# Patient Record
Sex: Male | Born: 1935 | Race: Black or African American | Hispanic: No | Marital: Married | Smoking: Former smoker
Health system: Southern US, Community
[De-identification: ages and names within clinical notes are randomized; demographics above are authoritative.]

## PROBLEM LIST (undated history)

## (undated) DIAGNOSIS — D369 Benign neoplasm, unspecified site: Secondary | ICD-10-CM

## (undated) DIAGNOSIS — I10 Essential (primary) hypertension: Secondary | ICD-10-CM

## (undated) DIAGNOSIS — M109 Gout, unspecified: Secondary | ICD-10-CM

## (undated) DIAGNOSIS — G8220 Paraplegia, unspecified: Secondary | ICD-10-CM

## (undated) DIAGNOSIS — Z5189 Encounter for other specified aftercare: Secondary | ICD-10-CM

## (undated) DIAGNOSIS — F329 Major depressive disorder, single episode, unspecified: Secondary | ICD-10-CM

## (undated) DIAGNOSIS — K635 Polyp of colon: Secondary | ICD-10-CM

## (undated) DIAGNOSIS — L8990 Pressure ulcer of unspecified site, unspecified stage: Secondary | ICD-10-CM

## (undated) DIAGNOSIS — IMO0001 Reserved for inherently not codable concepts without codable children: Secondary | ICD-10-CM

## (undated) DIAGNOSIS — N390 Urinary tract infection, site not specified: Secondary | ICD-10-CM

## (undated) DIAGNOSIS — H269 Unspecified cataract: Secondary | ICD-10-CM

## (undated) DIAGNOSIS — J189 Pneumonia, unspecified organism: Secondary | ICD-10-CM

## (undated) DIAGNOSIS — N210 Calculus in bladder: Secondary | ICD-10-CM

## (undated) DIAGNOSIS — F32A Depression, unspecified: Secondary | ICD-10-CM

## (undated) DIAGNOSIS — M1990 Unspecified osteoarthritis, unspecified site: Secondary | ICD-10-CM

## (undated) DIAGNOSIS — K219 Gastro-esophageal reflux disease without esophagitis: Secondary | ICD-10-CM

## (undated) DIAGNOSIS — G629 Polyneuropathy, unspecified: Secondary | ICD-10-CM

## (undated) DIAGNOSIS — F0390 Unspecified dementia without behavioral disturbance: Secondary | ICD-10-CM

## (undated) DIAGNOSIS — M62838 Other muscle spasm: Secondary | ICD-10-CM

## (undated) HISTORY — DX: Pressure ulcer of unspecified site, unspecified stage: L89.90

## (undated) HISTORY — DX: Gastro-esophageal reflux disease without esophagitis: K21.9

## (undated) HISTORY — DX: Gout, unspecified: M10.9

## (undated) HISTORY — DX: Unspecified cataract: H26.9

## (undated) HISTORY — PX: NASAL FRACTURE SURGERY: SHX718

## (undated) HISTORY — DX: Urinary tract infection, site not specified: N39.0

## (undated) HISTORY — DX: Depression, unspecified: F32.A

## (undated) HISTORY — DX: Major depressive disorder, single episode, unspecified: F32.9

## (undated) HISTORY — DX: Polyp of colon: K63.5

## (undated) HISTORY — DX: Benign neoplasm, unspecified site: D36.9

## (undated) HISTORY — DX: Paraplegia, unspecified: G82.20

## (undated) HISTORY — DX: Unspecified dementia, unspecified severity, without behavioral disturbance, psychotic disturbance, mood disturbance, and anxiety: F03.90

## (undated) HISTORY — DX: Polyneuropathy, unspecified: G62.9

## (undated) HISTORY — DX: Calculus in bladder: N21.0

## (undated) HISTORY — DX: Other muscle spasm: M62.838

## (undated) HISTORY — PX: BACK SURGERY: SHX140

---

## 1997-09-29 ENCOUNTER — Emergency Department (HOSPITAL_COMMUNITY): Admission: EM | Admit: 1997-09-29 | Discharge: 1997-09-29 | Payer: Self-pay | Admitting: Emergency Medicine

## 1997-11-23 ENCOUNTER — Ambulatory Visit (HOSPITAL_COMMUNITY): Admission: RE | Admit: 1997-11-23 | Discharge: 1997-11-23 | Payer: Self-pay | Admitting: Neurosurgery

## 2000-12-05 ENCOUNTER — Encounter: Payer: Self-pay | Admitting: Urology

## 2000-12-10 ENCOUNTER — Ambulatory Visit (HOSPITAL_COMMUNITY): Admission: RE | Admit: 2000-12-10 | Discharge: 2000-12-10 | Payer: Self-pay | Admitting: Urology

## 2000-12-10 ENCOUNTER — Encounter (INDEPENDENT_AMBULATORY_CARE_PROVIDER_SITE_OTHER): Payer: Self-pay | Admitting: Specialist

## 2004-05-30 ENCOUNTER — Ambulatory Visit: Payer: Self-pay | Admitting: Internal Medicine

## 2004-07-12 ENCOUNTER — Ambulatory Visit: Payer: Self-pay | Admitting: Internal Medicine

## 2004-07-13 ENCOUNTER — Encounter: Admission: RE | Admit: 2004-07-13 | Discharge: 2004-07-13 | Payer: Self-pay | Admitting: Internal Medicine

## 2004-07-28 ENCOUNTER — Ambulatory Visit: Payer: Self-pay | Admitting: Internal Medicine

## 2005-04-03 ENCOUNTER — Ambulatory Visit: Payer: Self-pay | Admitting: Internal Medicine

## 2005-08-21 ENCOUNTER — Ambulatory Visit: Payer: Self-pay | Admitting: Internal Medicine

## 2005-10-23 ENCOUNTER — Ambulatory Visit: Payer: Self-pay | Admitting: Internal Medicine

## 2006-04-12 ENCOUNTER — Ambulatory Visit: Payer: Self-pay | Admitting: Internal Medicine

## 2006-10-14 ENCOUNTER — Encounter: Payer: Self-pay | Admitting: Internal Medicine

## 2006-10-14 ENCOUNTER — Ambulatory Visit: Payer: Self-pay | Admitting: Internal Medicine

## 2006-10-28 ENCOUNTER — Encounter: Payer: Self-pay | Admitting: Internal Medicine

## 2006-11-06 ENCOUNTER — Emergency Department (HOSPITAL_COMMUNITY): Admission: EM | Admit: 2006-11-06 | Discharge: 2006-11-06 | Payer: Self-pay | Admitting: Emergency Medicine

## 2006-11-13 DIAGNOSIS — I10 Essential (primary) hypertension: Secondary | ICD-10-CM | POA: Insufficient documentation

## 2006-11-13 DIAGNOSIS — M1990 Unspecified osteoarthritis, unspecified site: Secondary | ICD-10-CM | POA: Insufficient documentation

## 2006-11-25 ENCOUNTER — Ambulatory Visit: Payer: Self-pay | Admitting: Internal Medicine

## 2006-11-25 DIAGNOSIS — T887XXA Unspecified adverse effect of drug or medicament, initial encounter: Secondary | ICD-10-CM

## 2006-11-25 DIAGNOSIS — M109 Gout, unspecified: Secondary | ICD-10-CM

## 2006-11-25 LAB — CONVERTED CEMR LAB
Chloride: 110 meq/L (ref 96–112)
Creatinine, Ser: 1.1 mg/dL (ref 0.4–1.5)
Sodium: 141 meq/L (ref 135–145)

## 2007-02-18 ENCOUNTER — Emergency Department (HOSPITAL_COMMUNITY): Admission: EM | Admit: 2007-02-18 | Discharge: 2007-02-19 | Payer: Self-pay | Admitting: Emergency Medicine

## 2007-02-19 ENCOUNTER — Telehealth: Payer: Self-pay | Admitting: Internal Medicine

## 2007-03-17 ENCOUNTER — Ambulatory Visit: Payer: Self-pay | Admitting: Internal Medicine

## 2007-03-28 ENCOUNTER — Ambulatory Visit: Payer: Self-pay | Admitting: Internal Medicine

## 2007-03-28 DIAGNOSIS — F329 Major depressive disorder, single episode, unspecified: Secondary | ICD-10-CM

## 2007-03-28 DIAGNOSIS — K219 Gastro-esophageal reflux disease without esophagitis: Secondary | ICD-10-CM

## 2007-04-07 ENCOUNTER — Ambulatory Visit: Payer: Self-pay | Admitting: Gastroenterology

## 2007-05-19 ENCOUNTER — Telehealth: Payer: Self-pay | Admitting: Internal Medicine

## 2007-05-19 ENCOUNTER — Ambulatory Visit: Payer: Self-pay | Admitting: Internal Medicine

## 2007-05-19 DIAGNOSIS — S335XXA Sprain of ligaments of lumbar spine, initial encounter: Secondary | ICD-10-CM

## 2007-05-19 LAB — CONVERTED CEMR LAB
Blood in Urine, dipstick: NEGATIVE
Glucose, Urine, Semiquant: NEGATIVE
Protein, U semiquant: NEGATIVE
WBC Urine, dipstick: NEGATIVE

## 2007-11-10 ENCOUNTER — Ambulatory Visit: Payer: Self-pay | Admitting: Internal Medicine

## 2008-02-11 ENCOUNTER — Ambulatory Visit: Payer: Self-pay | Admitting: Internal Medicine

## 2008-02-11 DIAGNOSIS — G4700 Insomnia, unspecified: Secondary | ICD-10-CM | POA: Insufficient documentation

## 2008-02-11 LAB — CONVERTED CEMR LAB
BUN: 24 mg/dL — ABNORMAL HIGH (ref 6–23)
Basophils Relative: 0.3 % (ref 0.0–3.0)
Creatinine, Ser: 1 mg/dL (ref 0.4–1.5)
Eosinophils Absolute: 0.2 10*3/uL (ref 0.0–0.7)
Eosinophils Relative: 5.8 % — ABNORMAL HIGH (ref 0.0–5.0)
GFR calc Af Amer: 94 mL/min
GFR calc non Af Amer: 78 mL/min
HCT: 42.1 % (ref 39.0–52.0)
Hemoglobin: 14.3 g/dL (ref 13.0–17.0)
MCV: 88.7 fL (ref 78.0–100.0)
Monocytes Absolute: 0.3 10*3/uL (ref 0.1–1.0)
Neutro Abs: 1.5 10*3/uL (ref 1.4–7.7)
RBC: 4.75 M/uL (ref 4.22–5.81)
WBC: 3.3 10*3/uL — ABNORMAL LOW (ref 4.5–10.5)

## 2008-02-25 ENCOUNTER — Ambulatory Visit: Payer: Self-pay | Admitting: Internal Medicine

## 2008-02-25 ENCOUNTER — Inpatient Hospital Stay (HOSPITAL_COMMUNITY): Admission: AC | Admit: 2008-02-25 | Discharge: 2008-03-19 | Payer: Self-pay

## 2008-02-25 ENCOUNTER — Ambulatory Visit: Payer: Self-pay | Admitting: Surgery

## 2008-02-25 ENCOUNTER — Ambulatory Visit: Payer: Self-pay | Admitting: Critical Care Medicine

## 2008-02-26 ENCOUNTER — Ambulatory Visit: Payer: Self-pay | Admitting: Surgery

## 2008-03-08 ENCOUNTER — Ambulatory Visit: Payer: Self-pay | Admitting: Physical Medicine & Rehabilitation

## 2008-03-18 ENCOUNTER — Encounter: Payer: Self-pay | Admitting: Internal Medicine

## 2008-04-12 ENCOUNTER — Emergency Department (HOSPITAL_COMMUNITY): Admission: EM | Admit: 2008-04-12 | Discharge: 2008-04-13 | Payer: Self-pay | Admitting: *Deleted

## 2008-05-26 ENCOUNTER — Encounter: Payer: Self-pay | Admitting: Internal Medicine

## 2008-05-26 ENCOUNTER — Encounter: Admission: RE | Admit: 2008-05-26 | Discharge: 2008-05-26 | Payer: Self-pay | Admitting: Neurosurgery

## 2008-06-15 ENCOUNTER — Encounter: Payer: Self-pay | Admitting: Internal Medicine

## 2008-06-18 ENCOUNTER — Inpatient Hospital Stay (HOSPITAL_COMMUNITY)
Admission: RE | Admit: 2008-06-18 | Discharge: 2008-07-09 | Payer: Self-pay | Admitting: Physical Medicine & Rehabilitation

## 2008-06-18 ENCOUNTER — Ambulatory Visit: Payer: Self-pay | Admitting: Physical Medicine & Rehabilitation

## 2008-06-19 ENCOUNTER — Ambulatory Visit: Payer: Self-pay | Admitting: Physical Medicine & Rehabilitation

## 2008-08-31 ENCOUNTER — Telehealth: Payer: Self-pay | Admitting: Internal Medicine

## 2008-09-07 ENCOUNTER — Telehealth: Payer: Self-pay | Admitting: Internal Medicine

## 2008-09-09 ENCOUNTER — Encounter
Admission: RE | Admit: 2008-09-09 | Discharge: 2008-09-09 | Payer: Self-pay | Admitting: Physical Medicine & Rehabilitation

## 2008-09-14 ENCOUNTER — Telehealth: Payer: Self-pay | Admitting: Internal Medicine

## 2008-09-14 DIAGNOSIS — M6281 Muscle weakness (generalized): Secondary | ICD-10-CM

## 2008-09-15 ENCOUNTER — Ambulatory Visit: Payer: Self-pay | Admitting: Internal Medicine

## 2008-09-15 DIAGNOSIS — IMO0002 Reserved for concepts with insufficient information to code with codable children: Secondary | ICD-10-CM | POA: Insufficient documentation

## 2008-09-16 ENCOUNTER — Ambulatory Visit: Payer: Self-pay | Admitting: Internal Medicine

## 2008-09-23 ENCOUNTER — Telehealth (INDEPENDENT_AMBULATORY_CARE_PROVIDER_SITE_OTHER): Payer: Self-pay | Admitting: *Deleted

## 2008-09-28 ENCOUNTER — Telehealth: Payer: Self-pay | Admitting: Internal Medicine

## 2008-09-30 ENCOUNTER — Ambulatory Visit: Payer: Self-pay | Admitting: Internal Medicine

## 2008-09-30 ENCOUNTER — Telehealth: Payer: Self-pay | Admitting: Internal Medicine

## 2008-09-30 ENCOUNTER — Inpatient Hospital Stay (HOSPITAL_COMMUNITY): Admission: EM | Admit: 2008-09-30 | Discharge: 2008-10-04 | Payer: Self-pay | Admitting: Emergency Medicine

## 2008-09-30 DIAGNOSIS — R197 Diarrhea, unspecified: Secondary | ICD-10-CM

## 2008-10-08 ENCOUNTER — Telehealth: Payer: Self-pay | Admitting: Internal Medicine

## 2008-10-12 ENCOUNTER — Encounter: Admission: RE | Admit: 2008-10-12 | Discharge: 2009-01-10 | Payer: Self-pay | Admitting: Internal Medicine

## 2008-10-26 ENCOUNTER — Telehealth (INDEPENDENT_AMBULATORY_CARE_PROVIDER_SITE_OTHER): Payer: Self-pay | Admitting: *Deleted

## 2008-10-27 ENCOUNTER — Encounter: Payer: Self-pay | Admitting: Internal Medicine

## 2008-10-29 ENCOUNTER — Ambulatory Visit: Payer: Self-pay | Admitting: Internal Medicine

## 2008-11-03 ENCOUNTER — Encounter: Payer: Self-pay | Admitting: Internal Medicine

## 2008-11-22 ENCOUNTER — Telehealth (INDEPENDENT_AMBULATORY_CARE_PROVIDER_SITE_OTHER): Payer: Self-pay | Admitting: *Deleted

## 2008-12-19 ENCOUNTER — Encounter: Payer: Self-pay | Admitting: Family Medicine

## 2008-12-19 ENCOUNTER — Emergency Department (HOSPITAL_COMMUNITY): Admission: EM | Admit: 2008-12-19 | Discharge: 2008-12-19 | Payer: Self-pay | Admitting: Emergency Medicine

## 2008-12-28 ENCOUNTER — Telehealth: Payer: Self-pay | Admitting: Internal Medicine

## 2008-12-29 ENCOUNTER — Ambulatory Visit: Payer: Self-pay | Admitting: Family Medicine

## 2008-12-29 DIAGNOSIS — R1084 Generalized abdominal pain: Secondary | ICD-10-CM

## 2008-12-31 ENCOUNTER — Telehealth: Payer: Self-pay | Admitting: Internal Medicine

## 2009-01-05 ENCOUNTER — Encounter: Payer: Self-pay | Admitting: Internal Medicine

## 2009-01-12 ENCOUNTER — Ambulatory Visit: Payer: Self-pay | Admitting: Internal Medicine

## 2009-01-12 DIAGNOSIS — K279 Peptic ulcer, site unspecified, unspecified as acute or chronic, without hemorrhage or perforation: Secondary | ICD-10-CM | POA: Insufficient documentation

## 2009-01-12 LAB — CONVERTED CEMR LAB
AST: 16 units/L (ref 0–37)
Albumin: 3.4 g/dL — ABNORMAL LOW (ref 3.5–5.2)
Amylase: 98 units/L (ref 27–131)
Basophils Absolute: 0 10*3/uL (ref 0.0–0.1)
Eosinophils Absolute: 0.2 10*3/uL (ref 0.0–0.7)
Lymphocytes Relative: 35 % (ref 12.0–46.0)
MCHC: 33.1 g/dL (ref 30.0–36.0)
MCV: 87.9 fL (ref 78.0–100.0)
Monocytes Absolute: 0.3 10*3/uL (ref 0.1–1.0)
Neutro Abs: 1.6 10*3/uL (ref 1.4–7.7)
Neutrophils Relative %: 50.2 % (ref 43.0–77.0)
RDW: 14.8 % — ABNORMAL HIGH (ref 11.5–14.6)
Saturation Ratios: 31 % (ref 20.0–50.0)
Total Protein: 6 g/dL (ref 6.0–8.3)
Transferrin: 218.6 mg/dL (ref 212.0–360.0)

## 2009-01-14 ENCOUNTER — Telehealth: Payer: Self-pay | Admitting: Internal Medicine

## 2009-01-20 ENCOUNTER — Encounter: Payer: Self-pay | Admitting: Internal Medicine

## 2009-01-25 ENCOUNTER — Encounter (INDEPENDENT_AMBULATORY_CARE_PROVIDER_SITE_OTHER): Payer: Self-pay | Admitting: *Deleted

## 2009-01-25 ENCOUNTER — Encounter: Admission: RE | Admit: 2009-01-25 | Discharge: 2009-04-04 | Payer: Self-pay | Admitting: Internal Medicine

## 2009-01-27 ENCOUNTER — Telehealth: Payer: Self-pay | Admitting: Internal Medicine

## 2009-01-28 ENCOUNTER — Ambulatory Visit: Payer: Self-pay | Admitting: Internal Medicine

## 2009-01-28 DIAGNOSIS — K5900 Constipation, unspecified: Secondary | ICD-10-CM | POA: Insufficient documentation

## 2009-01-28 DIAGNOSIS — R142 Eructation: Secondary | ICD-10-CM

## 2009-01-28 DIAGNOSIS — R143 Flatulence: Secondary | ICD-10-CM

## 2009-01-28 DIAGNOSIS — R141 Gas pain: Secondary | ICD-10-CM

## 2009-01-28 LAB — CONVERTED CEMR LAB
Basophils Absolute: 0 10*3/uL (ref 0.0–0.1)
Basophils Relative: 0.6 % (ref 0.0–3.0)
Eosinophils Absolute: 0.1 10*3/uL (ref 0.0–0.7)
Lipase: 8 units/L — ABNORMAL LOW (ref 11.0–59.0)
Lymphocytes Relative: 36.4 % (ref 12.0–46.0)
MCHC: 33.7 g/dL (ref 30.0–36.0)
Monocytes Absolute: 0.4 10*3/uL (ref 0.1–1.0)
Neutrophils Relative %: 45.3 % (ref 43.0–77.0)
Platelets: 194 10*3/uL (ref 150.0–400.0)
RBC: 4.23 M/uL (ref 4.22–5.81)

## 2009-02-02 ENCOUNTER — Telehealth: Payer: Self-pay | Admitting: Physician Assistant

## 2009-02-02 ENCOUNTER — Ambulatory Visit: Payer: Self-pay | Admitting: Gastroenterology

## 2009-02-02 ENCOUNTER — Inpatient Hospital Stay (HOSPITAL_COMMUNITY): Admission: AD | Admit: 2009-02-02 | Discharge: 2009-02-04 | Payer: Self-pay | Admitting: Gastroenterology

## 2009-02-03 ENCOUNTER — Encounter: Payer: Self-pay | Admitting: Internal Medicine

## 2009-02-04 ENCOUNTER — Encounter (INDEPENDENT_AMBULATORY_CARE_PROVIDER_SITE_OTHER): Payer: Self-pay | Admitting: *Deleted

## 2009-02-04 ENCOUNTER — Encounter: Payer: Self-pay | Admitting: Gastroenterology

## 2009-02-04 DIAGNOSIS — K635 Polyp of colon: Secondary | ICD-10-CM

## 2009-02-04 DIAGNOSIS — D369 Benign neoplasm, unspecified site: Secondary | ICD-10-CM

## 2009-02-04 HISTORY — DX: Benign neoplasm, unspecified site: D36.9

## 2009-02-04 HISTORY — DX: Polyp of colon: K63.5

## 2009-02-07 ENCOUNTER — Telehealth: Payer: Self-pay | Admitting: Gastroenterology

## 2009-02-09 ENCOUNTER — Encounter: Payer: Self-pay | Admitting: Gastroenterology

## 2009-02-11 ENCOUNTER — Encounter: Payer: Self-pay | Admitting: Internal Medicine

## 2009-02-14 ENCOUNTER — Ambulatory Visit: Payer: Self-pay | Admitting: Internal Medicine

## 2009-02-14 DIAGNOSIS — K208 Other esophagitis: Secondary | ICD-10-CM

## 2009-03-15 ENCOUNTER — Ambulatory Visit: Payer: Self-pay | Admitting: Gastroenterology

## 2009-03-22 ENCOUNTER — Encounter: Payer: Self-pay | Admitting: Internal Medicine

## 2009-04-26 ENCOUNTER — Encounter (INDEPENDENT_AMBULATORY_CARE_PROVIDER_SITE_OTHER): Payer: Self-pay | Admitting: *Deleted

## 2009-04-28 ENCOUNTER — Ambulatory Visit: Payer: Self-pay | Admitting: Gastroenterology

## 2009-04-29 ENCOUNTER — Ambulatory Visit: Payer: Self-pay | Admitting: Gastroenterology

## 2009-04-29 ENCOUNTER — Telehealth: Payer: Self-pay | Admitting: Gastroenterology

## 2009-05-09 ENCOUNTER — Ambulatory Visit: Payer: Self-pay | Admitting: Internal Medicine

## 2009-05-09 DIAGNOSIS — R631 Polydipsia: Secondary | ICD-10-CM | POA: Insufficient documentation

## 2009-05-11 LAB — CONVERTED CEMR LAB
Basophils Relative: 0.1 % (ref 0.0–3.0)
Calcium: 8.2 mg/dL — ABNORMAL LOW (ref 8.4–10.5)
Eosinophils Absolute: 7.7 10*3/uL — ABNORMAL HIGH (ref 0.0–0.7)
GFR calc non Af Amer: 84.18 mL/min (ref >60)
Glucose, Bld: 78 mg/dL (ref 70–99)
Hemoglobin: 12.8 g/dL — ABNORMAL LOW (ref 13.0–17.0)
Hgb A1c MFr Bld: 5.7 % (ref 4.6–6.5)
Lymphocytes Relative: 13.6 % (ref 12.0–46.0)
Monocytes Relative: 4.9 % (ref 3.0–12.0)
Neutro Abs: 1.6 10*3/uL (ref 1.4–7.7)
Neutrophils Relative %: 14.1 % — ABNORMAL LOW (ref 43.0–77.0)
Potassium: 2.8 meq/L — CL (ref 3.5–5.1)
RBC: 4.29 M/uL (ref 4.22–5.81)
Sodium: 145 meq/L (ref 135–145)
WBC: 11.5 10*3/uL — ABNORMAL HIGH (ref 4.5–10.5)

## 2009-05-16 ENCOUNTER — Ambulatory Visit: Payer: Self-pay | Admitting: Internal Medicine

## 2009-06-10 ENCOUNTER — Ambulatory Visit: Payer: Self-pay | Admitting: Internal Medicine

## 2009-06-10 LAB — CONVERTED CEMR LAB: Potassium: 3.7 meq/L (ref 3.5–5.1)

## 2009-06-14 ENCOUNTER — Telehealth: Payer: Self-pay | Admitting: Internal Medicine

## 2009-06-14 ENCOUNTER — Ambulatory Visit: Payer: Self-pay | Admitting: Internal Medicine

## 2009-07-08 ENCOUNTER — Ambulatory Visit: Payer: Self-pay | Admitting: Internal Medicine

## 2009-07-08 DIAGNOSIS — G609 Hereditary and idiopathic neuropathy, unspecified: Secondary | ICD-10-CM | POA: Insufficient documentation

## 2009-07-08 LAB — CONVERTED CEMR LAB

## 2009-07-11 ENCOUNTER — Telehealth: Payer: Self-pay | Admitting: *Deleted

## 2009-07-22 ENCOUNTER — Telehealth: Payer: Self-pay | Admitting: Internal Medicine

## 2009-08-26 ENCOUNTER — Emergency Department (HOSPITAL_COMMUNITY): Admission: EM | Admit: 2009-08-26 | Discharge: 2009-08-26 | Payer: Self-pay | Admitting: Emergency Medicine

## 2009-08-26 ENCOUNTER — Telehealth: Payer: Self-pay | Admitting: Internal Medicine

## 2009-10-14 ENCOUNTER — Telehealth: Payer: Self-pay | Admitting: Internal Medicine

## 2009-11-04 ENCOUNTER — Ambulatory Visit: Payer: Self-pay | Admitting: Gastroenterology

## 2009-11-04 DIAGNOSIS — R1012 Left upper quadrant pain: Secondary | ICD-10-CM | POA: Insufficient documentation

## 2009-11-04 LAB — CONVERTED CEMR LAB
Basophils Absolute: 0 10*3/uL (ref 0.0–0.1)
CO2: 30 meq/L (ref 19–32)
Chloride: 104 meq/L (ref 96–112)
Eosinophils Relative: 6.1 % — ABNORMAL HIGH (ref 0.0–5.0)
Glucose, Bld: 82 mg/dL (ref 70–99)
HCT: 36.9 % — ABNORMAL LOW (ref 39.0–52.0)
Hemoglobin: 12.3 g/dL — ABNORMAL LOW (ref 13.0–17.0)
Lymphocytes Relative: 33.1 % (ref 12.0–46.0)
Lymphs Abs: 1.3 10*3/uL (ref 0.7–4.0)
Monocytes Relative: 8.4 % (ref 3.0–12.0)
Neutro Abs: 2.1 10*3/uL (ref 1.4–7.7)
Platelets: 227 10*3/uL (ref 150.0–400.0)
RDW: 15.4 % — ABNORMAL HIGH (ref 11.5–14.6)
Sodium: 138 meq/L (ref 135–145)
WBC: 4 10*3/uL — ABNORMAL LOW (ref 4.5–10.5)

## 2009-11-07 ENCOUNTER — Ambulatory Visit: Payer: Self-pay | Admitting: Cardiovascular Disease

## 2009-11-10 ENCOUNTER — Ambulatory Visit: Payer: Self-pay | Admitting: Internal Medicine

## 2009-11-10 DIAGNOSIS — R079 Chest pain, unspecified: Secondary | ICD-10-CM

## 2009-11-10 DIAGNOSIS — N401 Enlarged prostate with lower urinary tract symptoms: Secondary | ICD-10-CM

## 2010-01-10 ENCOUNTER — Ambulatory Visit: Payer: Self-pay | Admitting: Internal Medicine

## 2010-03-13 ENCOUNTER — Ambulatory Visit: Payer: Self-pay | Admitting: Internal Medicine

## 2010-03-13 DIAGNOSIS — R609 Edema, unspecified: Secondary | ICD-10-CM

## 2010-03-13 LAB — CONVERTED CEMR LAB
ALT: 14 units/L (ref 0–53)
AST: 19 units/L (ref 0–37)
Albumin: 3.7 g/dL (ref 3.5–5.2)
BUN: 19 mg/dL (ref 6–23)
Chloride: 109 meq/L (ref 96–112)
Creatinine, Ser: 0.9 mg/dL (ref 0.4–1.5)
Glucose, Bld: 86 mg/dL (ref 70–99)
Hgb A1c MFr Bld: 5.8 % (ref 4.6–6.5)
Potassium: 3.6 meq/L (ref 3.5–5.1)
Total Protein: 6.3 g/dL (ref 6.0–8.3)

## 2010-03-21 ENCOUNTER — Telehealth: Payer: Self-pay | Admitting: Internal Medicine
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
## 2010-03-23 IMAGING — CR DG CHEST 1V PORT
1 series · 1 of 1 positions shown · non-contrast
Comparison: Earlier today.

CLINICAL DATA: PICC placement.

PORTABLE CHEST - 1 VIEW

[AP]
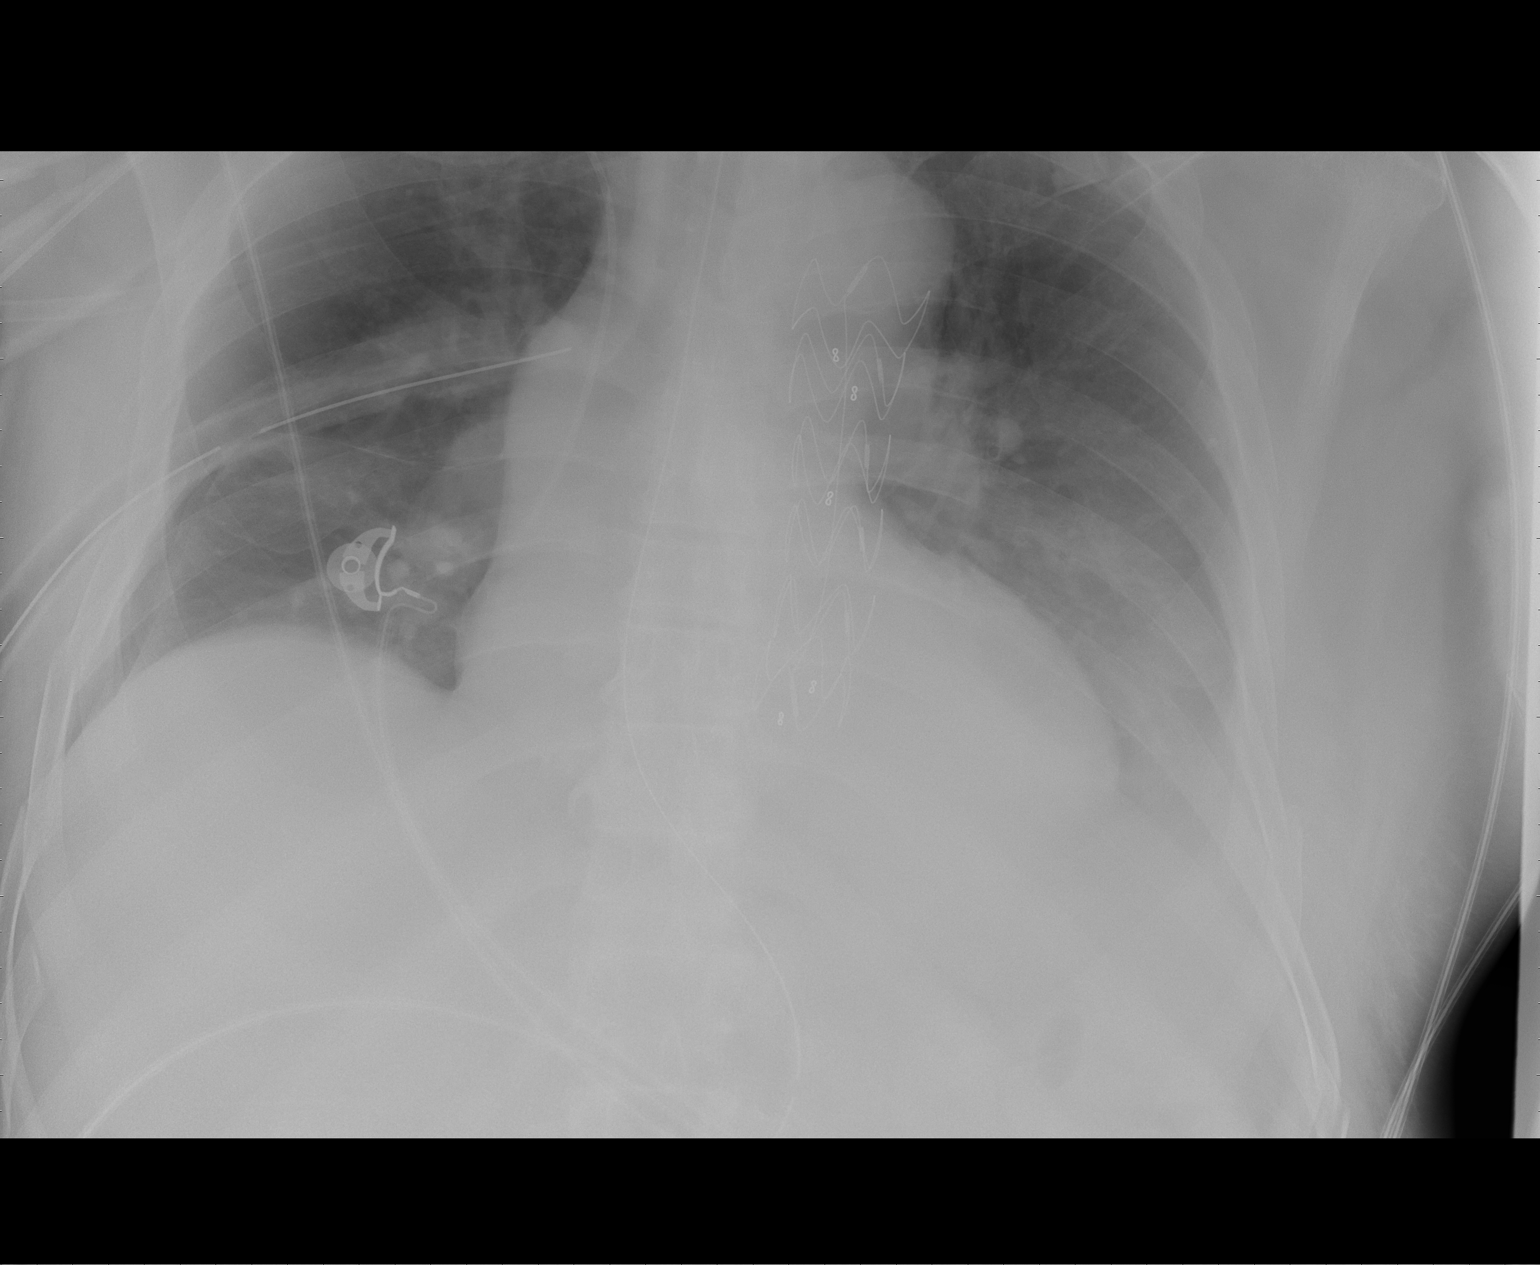

[1 of 1 positions shown; findings below may reference images not displayed]

FINDINGS: An interval right PICC is demonstrated with its tip at
the junction of the superior vena cava and right atrium.  A right
chest tube remains in place.  Currently, an approximately 10% right
lateral pneumothorax is noted.  An aortic stent remains in place.
Nasogastric tube extending into the stomach.  Previously noted
bilateral rib fractures.  Stable borderline enlargement of the
cardiac silhouette and left basilar opacity.
IMPRESSION: 1.  Approximately 10% right lateral pneumothorax.
2.  Right PICC tip at the junction of the superior vena cava and
right atrium.
3.  Stable left basilar atelectasis, pulmonary contusion or
pneumonia and probable left pleural effusion.

## 2010-03-23 IMAGING — CR DG CHEST 1V PORT
1 series · 1 of 1 positions shown · non-contrast
Comparison: Same day at five:  53

CLINICAL DATA: Endotracheal tube, hypoxia

PORTABLE CHEST - 1 VIEW

[view not recorded]
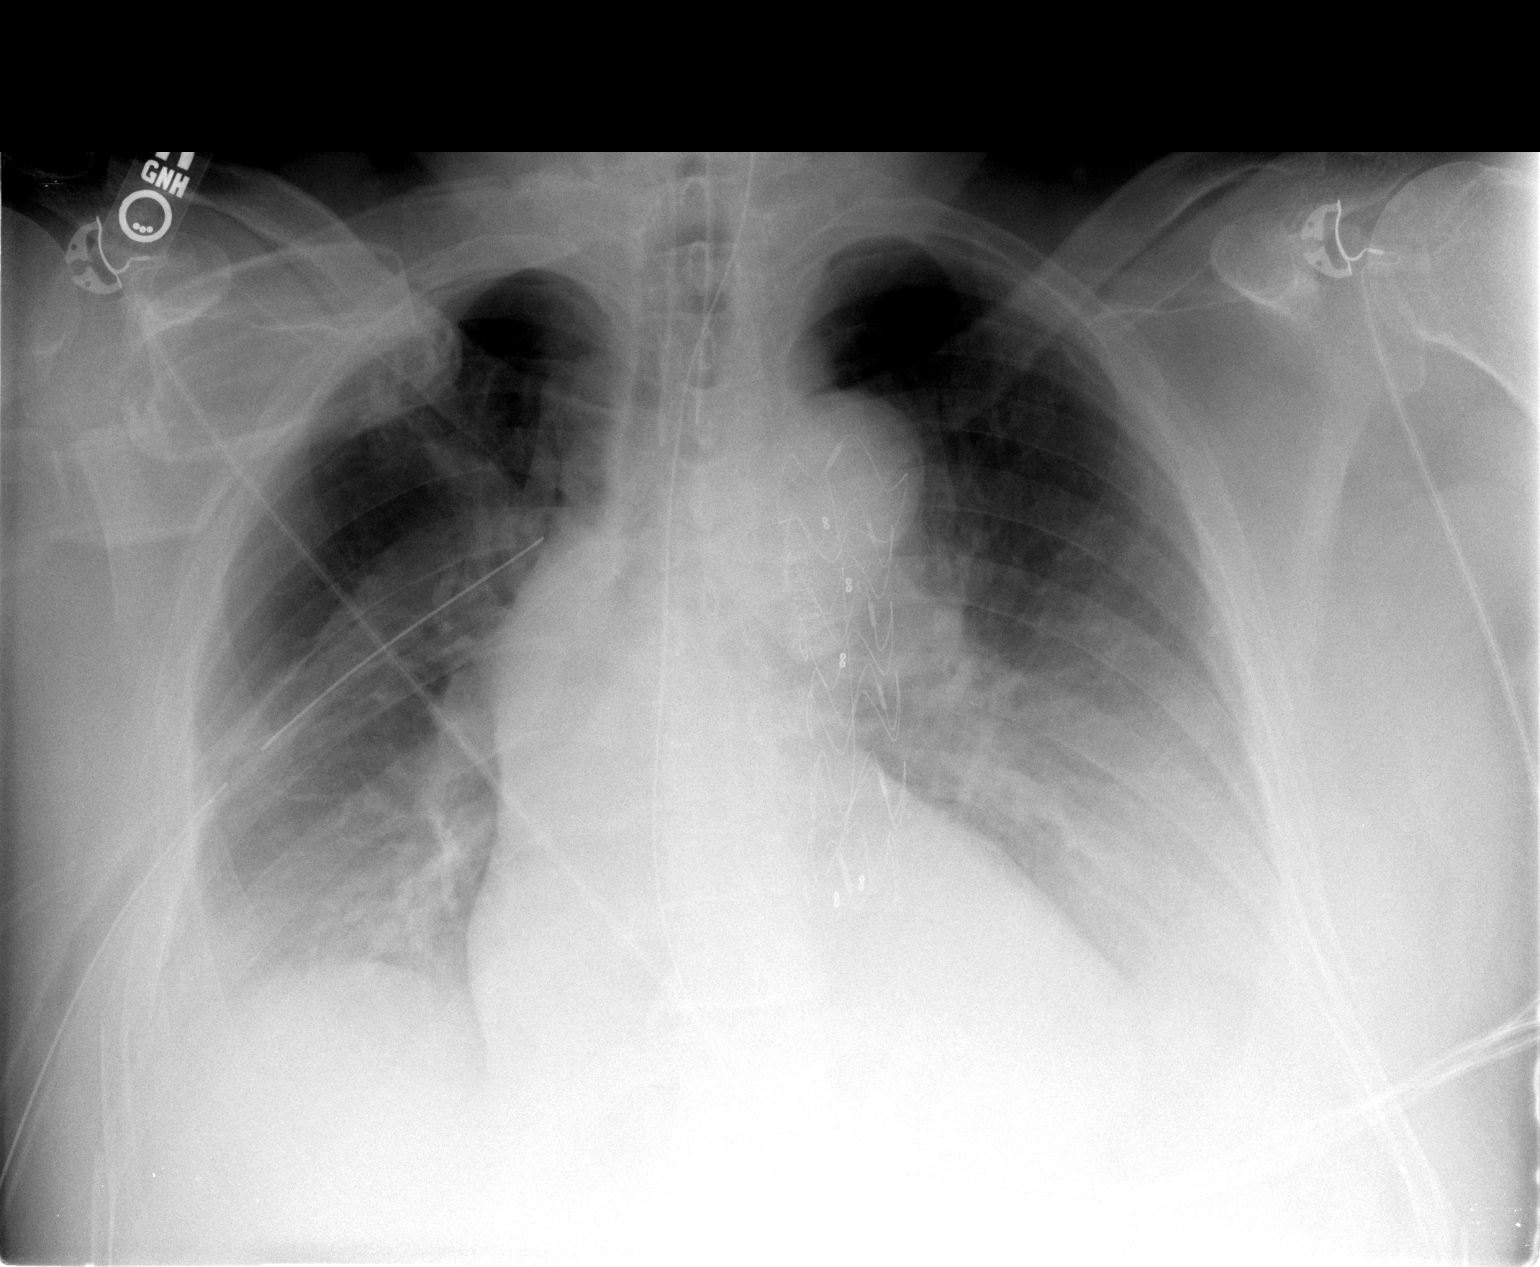

[1 of 1 positions shown; findings below may reference images not displayed]

FINDINGS: Cardiomediastinal silhouette is stable.  Right chest tube
is unchanged in position.  Endotracheal tube in place noted with
tip 4.4 cm above the carina.  NG tube is unchanged in position.
There is mild interstitial prominence bilaterally suspicious for
mild congestion/edema.  There is bilateral small pleural effusion
with bilateral basilar atelectasis or lung contusion.  Thoracic
aortic stent again noted unchanged in position. Bilateral rib
fractures again noted.
IMPRESSION: Endotracheal tube in place with tip 4.4 cm above the
carina.
Right chest tube is unchanged in position.  No diagnostic
pneumothorax.  Mild interstitial prominence bilaterally suspicious
for mild congestion/edema.  Bilateral small pleural effusion with
bilateral basilar atelectasis or lung contusion.  Bilateral rib
fractures again noted.
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
## 2010-03-29 ENCOUNTER — Encounter: Payer: Self-pay | Admitting: Internal Medicine

## 2010-04-11 ENCOUNTER — Encounter: Payer: Self-pay | Admitting: Internal Medicine

## 2010-05-09 NOTE — Miscellaneous (Signed)
Summary: Discharge Summary for PT Services/[facility]  Discharge Summary for PT Services/[facility]   Imported By: Maryln Gottron 04/15/2009 10:15:48  _____________________________________________________________________  External Attachment:    Type:   Image     Comment:   External Document
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
## 2010-05-09 NOTE — Progress Notes (Signed)
   Phone Note Outgoing Call   Call placed by: karen tyrrell Call placed to: Mrs Dia, spouse Action Taken: Economist Details for Reason: pt.'s c/o gas Summary of Call: PT. ON DC STATING HE HAS ALOT OF GAS AND OVER THE COUNTER MEDS NOT HELPING,REQUESTING RX FROM MD. MD IN PROCEDURE. DISCUSSED WITH DR. Christella Hartigan FOLLOWING HIS PROCEDURES.  CALL PLACED TO WIFE, INSTRUCTED TO INCREASE USUAGE OF GASX, PER DR. Netanel Yannuzzi. IF PT. CONTINUES WITH GAS COMPLAINTS. DR. Christella Hartigan REQUESTS A CALL TO HIS OFFICE ON MONDAY, 05/02/09. WIFE VERBALIZED UNDERSTANDING AND EXPRESSED GRATITUDE TO DR. Christella Hartigan.  Initial call taken by: Weston Brass,  April 29, 2009 5:05 PM

## 2010-05-09 NOTE — Procedures (Signed)
Summary: Upper Endoscopy  Patient: Nadeem Romanoski Note: All result statuses are Final unless otherwise noted.  Tests: (1) Upper Endoscopy (EGD)   EGD Upper Endoscopy       DONE     [facility]     [address]     [place], [place]  [address]           ENDOSCOPY PROCEDURE REPORT           PATIENT:  Lakoda, Mcanany  MR#:  [id number]     BIRTHDATE:  07-04-1933, 75 yrs. old  GENDER:  male           ENDOSCOPIST:  Rachael Fee, MD           PROCEDURE DATE:  04/29/2009     PROCEDURE:  EGD with biopsy     ASA CLASS:  Class II     INDICATIONS:  abnormal stomach 3 months ago by EGD (bx showed     extensive intestinal metaplasia)           MEDICATIONS:   Fentanyl 50 mcg IV, Versed 4 mg IV     TOPICAL ANESTHETIC:  Exactacain Spray           DESCRIPTION OF PROCEDURE:   After the risks benefits and     alternatives of the procedure were thoroughly explained, informed     consent was obtained.  The LB GIF-H180 [id number] endoscope was     introduced through the mouth and advanced to the second portion of     the duodenum, without limitations.  The instrument was slowly     withdrawn as the mucosa was fully examined.     <<PROCEDUREIMAGES>>           There was atrophic appearing gastritis throughout stomach. This     was randomly biopsied (jar 2). Distally there were two nodules,     the largest was about 1cm and appeared somewhat adenomatous,     neoplastic. This was extensively biopsied (jar 2). There was     moderate, non-specific duodenitis (see image1, image2, image5, and     image3).  Otherwise the examination was normal (see image6 and     image4).    Retroflexed views revealed no abnormalities.    The     scope was then withdrawn from the patient and the procedure     completed.           COMPLICATIONS:  None           ENDOSCOPIC IMPRESSION:     1) Atrophic gastritis throughout, biopsied.  Gastric nodules     distally, biopsied     2) Otherwise normal examination          RECOMMENDATIONS:     Await final pathology. Compared to EGD 3 months ago, gastric     findings appear to have probably worsened in severity.           ______________________________     Rachael Fee, MD           cc: Darryll Capers, MD           n.     Rosalie Doctor:   Rachael Fee at 04/29/2009 02:13 PM           Beckey Downing, [id number]  Note: An exclamation mark (!) indicates a result that was not dispersed into the flowsheet. Document Creation Date: 04/29/2009 2:13 PM _______________________________________________________________________  (1) Order result status: Final Collection  or observation date-time: 04/29/2009 14:04 Requested date-time:  Receipt date-time:  Reported date-time:  Referring Physician:   Ordering Physician: Rob Bunting [id number]) Specimen Source:  Source: Launa Grill Order Number: [id number] Lab site:

## 2010-05-09 NOTE — Progress Notes (Signed)
Summary: REFILL REQUEST  Phone Note Refill Request Message from:  Patient on October 14, 2009 10:43 AM  Refills Requested: Medication #1:  WELLBUTRIN XL 300 MG XR24H-TAB 1 once daily   Notes: [facility] - [address], [place].Marland KitchenMarland KitchenMarland KitchenPt adv that [facility] never [facility] a [facility] to the refill request that they sent.  Medication #2:  METHOCARBAMOL 500 MG TABS 1 two times a day prn   Notes: [facility] - [address], [place].Marland KitchenMarland KitchenMarland KitchenPt adv that [facility] is [facility] never [facility] a response to the refill request that they sent.  Pt req that Rx be re-sent..... [facility] stating [facility] never received a response to the refill request that they sent.   Initial call taken by: Debbra Riding,  October 14, 2009 10:45 AM    Prescriptions: WELLBUTRIN XL 300 MG XR24H-TAB (BUPROPION HCL) 1 once daily  #30 Tablet x 10   Entered by:   Lynann Beaver CMA   Authorized by:   Stacie Glaze MD   Signed by:   Lynann Beaver CMA on 10/14/2009   Method used:   [facility] to        [facility] [address] [phone]* (retail)       [address]       [place], [place]  [address]       Ph: [phone] or [phone]       Fax: [phone]   RxID:   [id number] METHOCARBAMOL 500 MG TABS (METHOCARBAMOL) 1 two times a day prn  #30 Tablet x 1   Entered by:   Lynann Beaver CMA   Authorized by:   Stacie Glaze MD   Signed by:   Lynann Beaver CMA on 10/14/2009   Method used:   [facility]        [facility] [address] [phone]* (retail)       [address]       [place], [place]  [address]       Ph: [phone] or [phone]       Fax: [phone]   RxID:   [id number]
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
## 2010-05-09 NOTE — Progress Notes (Signed)
Summary: chest pain  Phone Note Call from Patient   Caller: Patient Call For: Stacie Glaze MD Summary of Call: Pt is complaining of chest pain with muscle spasms that radiate to his back.  Per Dr. Lovell Sheehan.....Marland Kitchenadvised to go to the ER for evaluation. [phone] Initial call taken by: Lynann Beaver CMA,  Aug 26, 2009 8:17 AM  Follow-up for Phone Call        Pt. advised. Follow-up by: Lynann Beaver CMA,  Aug 26, 2009 8:17 AM  Additional Follow-up for Phone Call Additional follow up Details #1::        Left message on personal voice mail. Additional Follow-up by: Lynann Beaver CMA,  Aug 26, 2009 8:19 AM    Additional Follow-up for Phone Call Additional follow up Details #2::    Pt called back to ck status of his previous phone call.Marland KitchenMarland KitchenMarland KitchenPt was adv that Triage had called and left him a msg advising him of Dr Lovell Sheehan instructions - pt adv he had not received the msg??..... Pt was advised of Dr Lovell Sheehan instructions to go to ER for evaluation.Marland KitchenMarland KitchenMarland KitchenPt acknowledged same.   Follow-up by: Debbra Riding,  Aug 26, 2009 11:17 AM

## 2010-05-09 NOTE — Progress Notes (Signed)
Summary: FL2 form  Phone Note Call from Patient   Caller: [phone] Summary of Call: Thinking about putting [name] in assisted living.  Need FL2 form done. Initial call taken by: [name] Jew, RN,  June 14, 2009 12:25 PM  Follow-up for Phone Call        wife informed -ready for pick up Follow-up by: [name] Eddy, LPN,  June 14, 2009 2:00 PM

## 2010-05-09 NOTE — Miscellaneous (Signed)
Summary: pantoprazole  Clinical Lists Changes  Medications: Changed medication from PROTONIX 40 MG TBEC (PANTOPRAZOLE SODIUM) one by mouth BID to PANTOPRAZOLE SODIUM 40 MG TBEC (PANTOPRAZOLE SODIUM) 1 by mouth once daily - Signed Rx of PANTOPRAZOLE SODIUM 40 MG TBEC (PANTOPRAZOLE SODIUM) 1 by mouth once daily;  #30 x 11;  Signed;  Entered by: Chales Abrahams CMA (AAMA);  Authorized by: Rachael Fee MD;  Method used: [facility] [address] [phone]*, [address] [place], [place], [place]  [address], Ph: [phone] or [phone], Fax: [phone]    Prescriptions: PANTOPRAZOLE SODIUM 40 MG TBEC (PANTOPRAZOLE SODIUM) 1 by mouth once daily  #30 x 11   Entered by:   Chales Abrahams CMA (AAMA)   Authorized by:   Rachael Fee MD   Signed by:   Chales Abrahams CMA (AAMA) on 04/29/2009   Method used:   [facility]        [facility] [address] [phone]* (retail)       [address]       [place], [place]  [address]       Ph: [phone] or [phone]       Fax: [phone]   RxID:   [id number]

## 2010-05-09 NOTE — Progress Notes (Signed)
----   Converted from flag ---- ---- 2020-02-1710 8:03 AM, Rita Ohara wrote: This patient never came to the lab Fri. for lipid test. I spoke with him and he wanted to know if he can wait till his next visit for this? ------------------------------ dr Lovell Sheehan is aware and will have labs drawn at next [facility]

## 2010-05-09 NOTE — Progress Notes (Signed)
Summary: ringing in ears  Phone Note Call from Patient Call back at Work Phone    Caller: Patient Call For: Stacie Glaze MD Reason for Call: Referral Summary of Call: Pt. wants Dr. Lovell Sheehan to send in a RX for "ringing in his ears" to [facility]).[phone] Initial call taken by: Lynann Beaver CMA,  July 22, 2009 3:09 PM  Follow-up for Phone Call        if pt is taking otc antiinflammatory stop that --if not he needs to be checked by autiologist -per dr Lovell Sheehan Follow-up by: Willy Eddy, LPN,  July 22, 2009 4:22 PM  Additional Follow-up for Phone Call Additional follow up Details #1::        Pt. prefers to make his own appt.  He is not on any advil, aleve, etc. Additional Follow-up by: Lynann Beaver CMA,  July 22, 2009 4:25 PM

## 2010-05-09 NOTE — Miscellaneous (Signed)
Summary: Certification and Plan of Care/[facility]  Certification and Plan of Care/[facility]   Imported By: Maryln Gottron 06/17/2009 12:21:22  _____________________________________________________________________  External Attachment:    Type:   Image     Comment:   External Document

## 2010-05-09 NOTE — Miscellaneous (Signed)
Summary: 564.00 PER OV.Marland KitchenMarland KitchenEM  Clinical Lists Changes  Observations: Added new observation of ALLERGY REV: Done (04/28/2009 13:27)
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
## 2010-05-09 NOTE — Letter (Signed)
Summary: EGD Instructions  [facility]  [address] [place], [place] [address]   Phone: [phone]  Fax: [phone]       [name]    Aug 23, 1933    MRN: [id number]       Procedure Day Dorna Bloom:  [name]  04/29/09     Arrival Time: 1:00PM     Procedure Time: 2:00PM     Location of Procedure:                    [facility] (4th Floor)    PREPARATION FOR ENDOSCOPY   On1/21/11 THE DAY OF THE PROCEDURE:  1.   No solid foods, milk or milk products are allowed after midnight the night before your procedure.  2.   Do not drink anything colored red or purple.  Avoid juices with pulp.  No orange juice.  3.  You may drink clear liquids until12:00PM which is 2 hours before your procedure.                                                                                                CLEAR LIQUIDS INCLUDE: Water Jello Ice Popsicles Tea (sugar ok, no milk/cream) Powdered fruit flavored drinks Coffee (sugar ok, no milk/cream) Gatorade Juice: apple, white grape, white cranberry  Lemonade Clear bullion, consomm, broth Carbonated beverages (any kind) Strained chicken noodle soup Hard Candy   MEDICATION INSTRUCTIONS  Unless otherwise instructed, you should take regular prescription medications with a small sip of water as early as possible the morning of your procedure.   Additional medication instructions: _             OTHER INSTRUCTIONS  You will need a responsible adult at least 75 years of age to accompany you and drive you home.   This person must remain in the waiting room during your procedure.  Wear loose fitting clothing that is easily removed.  Leave jewelry and other valuables at home.  However, you may wish to bring a book to read or an iPod/MP3 player to listen to music as you wait for your procedure to start.  Remove all body piercing jewelry and leave at home.  Total time from sign-in until discharge is  approximately 2-3 hours.  You should go home directly after your procedure and rest.  You can resume normal activities the day after your procedure.  The day of your procedure you should not:   Drive   Make legal decisions   Operate machinery   Drink alcohol   Return to work  You will receive specific instructions about eating, activities and medications before you leave.    The above instructions have been reviewed and explained to me by   ___[name], RN____________________    I fully understand and can verbalize these instructions _____________________________ Date _________
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
## 2010-05-09 NOTE — Assessment & Plan Note (Signed)
Review of gastrointestinal problems: 1. Precancerous colon polyps, November 2010 colonoscopy. Next colonoscopy at five-year interval. 2. intestinal metaplasia in stomach, gastritis noted on EGD 2010. No H. pylori.  repeat EGD January 2011 Showed persistent intestinal metaplasia without dysplasia, no H. pylori. 3. constipation likely related to intermittent narcotics, non-ambulatory status     History of Present Illness Visit Type: Follow-up Visit Primary GI MD: Rob Bunting MD Primary Provider: Oliver Barre, MD Requesting Provider: Malachi Pro, MD Chief Complaint: constipation History of Present Illness:     77 year old man who is here with his wife today.  has a knot in left flank and pain in LUQ.  Eating does not change it.  Feels something raw inside stomach, rubbing with a t shirt will hurt.  sometimes just touching it with the bed sheets will cause it to hurt.           Current Medications (verified): 1)  Clonazepam 0.5 Mg Tabs (Clonazepam) .... One By Mouth Two Times A Day As Needed For Anxiety 2)  Multivitamins   Caps (Multiple Vitamin) .[name].. 1 Once Daily 3)  Hydrocodone-Acetaminophen 5-325 Mg Tabs (Hydrocodone-Acetaminophen) .... One By Mouth Q 6 Hours 4)  Metoprolol Tartrate 50 Mg Tabs (Metoprolol Tartrate) .... One By Mouth Bid 5)  Gabapentin 300 Mg Caps (Gabapentin) .... One By Mouth Three Times A Day 6)  Methocarbamol 500 Mg Tabs (Methocarbamol) .[name].. 1 Two Times A Day Prn 7)  Allopurinol 100 Mg Tabs (Allopurinol) .[name].. 1 Once Daily 8)  Amlodipine Besylate 10 Mg Tabs (Amlodipine Besylate) .... One By Mouth Day 9)  Pantoprazole Sodium 40 Mg Tbec (Pantoprazole Sodium) .[name].. 1 By Mouth Once Daily 10)  Tamsulosin Hcl 0.4 Mg Caps (Tamsulosin Hcl) .[name].. 1 Capsule By Mouth Once Daily 11)  Gas-X Extra Strength 125 Mg Caps (Simethicone) .... Take As Needed 12)  Miralax  Powd (Polyethylene Glycol 3350) .... Take 1/2 Capful Daily 13)  Zovirax 400 Mg Tabs (Acyclovir) .... Take 1 Tab  3 Times Daily X 7 Days 14)  Sucralfate 1 Gm Tabs (Sucralfate) .... One By Mouth Q Ac   Three Times A Day ( One Hour Before Meals 15)  Klor-Con M20 20 Meq Cr-Tabs (Potassium Chloride Crys Cr) .[name].. 1 Two Times A Day For 3 Days Then 1 Once Daily 16)  Wellbutrin Xl 300 Mg Xr24h-Tab (Bupropion Hcl) .[name].. 1 Once Daily 17)  Doxepin Hcl 10 Mg Caps (Doxepin Hcl) .... One By Mouth Q Hs  Allergies (verified): 1)  ! Nitroglycerin Cr (Nitroglycerin)  Vital Signs:  Patient profile:   77 year old male Pulse rate:   64 / minute Pulse rhythm:   regular BP sitting:   156 / 80  (left arm) Cuff size:   regular  Vitals Entered By: June McMurray CMA Duncan Dull) (November 04, 2009 3:38 PM)  Physical Exam  Additional Exam:  Constitutional: Sits in a wheelchair Psychiatric: alert and oriented times 3 Abdomen: soft, very mildly tender, fairly superficially in LUQ, non-distended, normal bowel sounds    Impression & Recommendations:  Problem # 1:  left upper quadrant discomfort unclear etiology. This really seems fairly superficial in nature. He had his wife feel like he has been losing weight however since he is in a wheelchair we have not been able to weigh him to confirm it. Given that fact and since he has had intestinal metaplasia, somewhat extensive in stomach, I think it is reasonable to proceed with CT scan to rule out obvious neoplastic cause. He'll get a basic  set of labs today as well including a CBC, basic metabolic profile.  Other Orders: TLB-CBC Platelet - w/Differential (85025-CBCD) TLB-BMP (Basic Metabolic Panel-BMET) (80048-METABOL)  Patient Instructions: 1)  You will be scheduled for a CT scan of abdomen and pelvis with IV and oral contrast for LUQ pain. 2)  You will get lab test(s) done today (cbc, bmet). 3)  The medication list was reviewed and reconciled.  All changed / newly prescribed medications were explained.  A complete medication list was provided to the patient / caregiver.  Appended  Document: Orders Update/CT    Clinical Lists Changes  Orders: Added new Referral order of CT Abdomen/Pelvis with Contrast (CT Abd/Pelvis w/con) - Signed
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
## 2010-05-11 NOTE — Medication Information (Signed)
Summary: Order for Wheelchair/[facility]  Order for Wheelchair/[facility]   Imported By: Maryln Gottron 04/14/2010 11:32:53  _____________________________________________________________________  External Attachment:    Type:   Image     Comment:   External Document

## 2010-05-11 NOTE — Medication Information (Signed)
Summary: Order for Wheelchair  Order for Wheelchair   Imported By: Maryln Gottron 04/05/2010 15:47:18  _____________________________________________________________________  External Attachment:    Type:   Image     Comment:   External Document

## 2010-05-11 NOTE — Progress Notes (Signed)
  Phone Note From Other Clinic   Summary of Call: home health case manager  and left mewssage on machine----------pt needs  new wheelchair and wants order faxed to [facility]-- pt states he gave paper to be completed at last ov for wheelchair Initial call taken by: Willy Eddy, LPN,  March 21, 2010 5:24 PM     Appended Document:  referral ascript faxed to [facility] [phone]  Appended Document:  Pt wants to know if Dr. Lovell Sheehan faxed for his wheelchair.  Appended Document:  Notified pt to check with [facility].  Appended Document:  refaxed again to the same number that [facility] says is correct

## 2010-05-31 ENCOUNTER — Other Ambulatory Visit: Payer: Self-pay | Admitting: Internal Medicine

## 2010-06-26 LAB — URINE MICROSCOPIC-ADD ON

## 2010-06-26 LAB — URINALYSIS, ROUTINE W REFLEX MICROSCOPIC
Glucose, UA: NEGATIVE mg/dL
Protein, ur: NEGATIVE mg/dL
Urobilinogen, UA: 1 mg/dL (ref 0.0–1.0)

## 2010-06-26 LAB — URINE CULTURE: Colony Count: >=100000

## 2010-06-26 LAB — DIFFERENTIAL
Basophils Relative: 0 % (ref 0–1)
Eosinophils Relative: 5 % (ref 0–5)
Monocytes Absolute: 0.3 10*3/uL (ref 0.1–1.0)
Monocytes Relative: 8 % (ref 3–12)
Neutro Abs: 1.5 10*3/uL — ABNORMAL LOW (ref 1.7–7.7)

## 2010-06-26 LAB — CBC
Platelets: 175 10*3/uL (ref 150–400)
RDW: 14.3 % (ref 11.5–15.5)
WBC: 3.3 10*3/uL — ABNORMAL LOW (ref 4.0–10.5)

## 2010-06-26 LAB — COMPREHENSIVE METABOLIC PANEL
ALT: 13 U/L (ref 0–53)
AST: 15 U/L (ref 0–37)
Albumin: 3.6 g/dL (ref 3.5–5.2)
Alkaline Phosphatase: 83 U/L (ref 39–117)
BUN: 10 mg/dL (ref 6–23)
GFR calc Af Amer: >60 mL/min (ref >60)
Potassium: 3.4 mEq/L — ABNORMAL LOW (ref 3.5–5.1)
Sodium: 143 mEq/L (ref 135–145)
Total Protein: 6 g/dL (ref 6.0–8.3)

## 2010-06-26 LAB — POCT CARDIAC MARKERS
CKMB, poc: 1.1 ng/mL (ref 1.0–8.0)
Myoglobin, poc: 77 ng/mL (ref 12–200)

## 2010-07-14 LAB — BASIC METABOLIC PANEL
BUN: 12 mg/dL (ref 6–23)
CO2: 29 mEq/L (ref 19–32)
Calcium: 9 mg/dL (ref 8.4–10.5)
Chloride: 106 mEq/L (ref 96–112)
Creatinine, Ser: 0.83 mg/dL (ref 0.4–1.5)
GFR calc Af Amer: >60 mL/min (ref >60)
Glucose, Bld: 94 mg/dL (ref 70–99)

## 2010-07-14 LAB — CBC
MCHC: 32.7 g/dL (ref 30.0–36.0)
MCV: 87.2 fL (ref 78.0–100.0)
Platelets: 215 10*3/uL (ref 150–400)
RBC: 4.56 MIL/uL (ref 4.22–5.81)
RDW: 15.4 % (ref 11.5–15.5)

## 2010-07-14 LAB — DIFFERENTIAL
Basophils Absolute: 0 10*3/uL (ref 0.0–0.1)
Basophils Relative: 0 % (ref 0–1)
Eosinophils Absolute: 0.2 10*3/uL (ref 0.0–0.7)
Monocytes Relative: 7 % (ref 3–12)
Neutro Abs: 1.5 10*3/uL — ABNORMAL LOW (ref 1.7–7.7)
Neutrophils Relative %: 48 % (ref 43–77)

## 2010-07-17 LAB — LIPID PANEL
HDL: 60 mg/dL (ref >39)
Triglycerides: 69 mg/dL (ref <150)
VLDL: 14 mg/dL (ref 0–40)

## 2010-07-17 LAB — URINALYSIS, ROUTINE W REFLEX MICROSCOPIC
Glucose, UA: NEGATIVE mg/dL
Hgb urine dipstick: NEGATIVE
Ketones, ur: NEGATIVE mg/dL
Nitrite: POSITIVE — AB
Protein, ur: NEGATIVE mg/dL
Specific Gravity, Urine: 1.028 (ref 1.005–1.030)
Urobilinogen, UA: 1 mg/dL (ref 0.0–1.0)
pH: 5.5 (ref 5.0–8.0)

## 2010-07-17 LAB — URINE CULTURE
Colony Count: >=100000
Special Requests: POSITIVE

## 2010-07-17 LAB — COMPREHENSIVE METABOLIC PANEL
ALT: 14 U/L (ref 0–53)
AST: 12 U/L (ref 0–37)
Albumin: 3 g/dL — ABNORMAL LOW (ref 3.5–5.2)
Albumin: 3.2 g/dL — ABNORMAL LOW (ref 3.5–5.2)
Alkaline Phosphatase: 71 U/L (ref 39–117)
Calcium: 9.2 mg/dL (ref 8.4–10.5)
Chloride: 108 mEq/L (ref 96–112)
GFR calc Af Amer: >60 mL/min (ref >60)
GFR calc Af Amer: >60 mL/min (ref >60)
Glucose, Bld: 98 mg/dL (ref 70–99)
Potassium: 3.4 mEq/L — ABNORMAL LOW (ref 3.5–5.1)
Sodium: 141 mEq/L (ref 135–145)
Sodium: 143 mEq/L (ref 135–145)
Total Bilirubin: 0.7 mg/dL (ref 0.3–1.2)
Total Protein: 5.4 g/dL — ABNORMAL LOW (ref 6.0–8.3)

## 2010-07-17 LAB — FOLATE RBC: RBC Folate: 624 ng/mL — ABNORMAL HIGH (ref 180–600)

## 2010-07-17 LAB — IRON AND TIBC
Saturation Ratios: 23 % (ref 20–55)
UIBC: 182 ug/dL

## 2010-07-17 LAB — DIFFERENTIAL
Basophils Absolute: 0 K/uL (ref 0.0–0.1)
Basophils Relative: 0 % (ref 0–1)
Eosinophils Absolute: 0.2 10*3/uL (ref 0.0–0.7)
Eosinophils Relative: 6 % — ABNORMAL HIGH (ref 0–5)
Lymphocytes Relative: 42 % (ref 12–46)
Lymphs Abs: 1.2 10*3/uL (ref 0.7–4.0)
Monocytes Absolute: 0.3 K/uL (ref 0.1–1.0)
Monocytes Relative: 9 % (ref 3–12)
Neutro Abs: 1.2 K/uL — ABNORMAL LOW (ref 1.7–7.7)
Neutrophils Relative %: 43 % (ref 43–77)

## 2010-07-17 LAB — URINE MICROSCOPIC-ADD ON

## 2010-07-17 LAB — CLOSTRIDIUM DIFFICILE EIA
C difficile Toxins A+B, EIA: NEGATIVE
C difficile Toxins A+B, EIA: NEGATIVE

## 2010-07-17 LAB — COMPREHENSIVE METABOLIC PANEL WITH GFR
AST: 14 U/L (ref 0–37)
Alkaline Phosphatase: 78 U/L (ref 39–117)
BUN: 13 mg/dL (ref 6–23)
CO2: 29 meq/L (ref 19–32)
Chloride: 109 meq/L (ref 96–112)
Creatinine, Ser: 0.91 mg/dL (ref 0.4–1.5)
GFR calc non Af Amer: >60 mL/min (ref >60)
Potassium: 4 meq/L (ref 3.5–5.1)
Total Bilirubin: 0.6 mg/dL (ref 0.3–1.2)

## 2010-07-17 LAB — BASIC METABOLIC PANEL
BUN: 4 mg/dL — ABNORMAL LOW (ref 6–23)
Chloride: 109 mEq/L (ref 96–112)
Chloride: 111 mEq/L (ref 96–112)
Creatinine, Ser: 0.83 mg/dL (ref 0.4–1.5)
GFR calc Af Amer: >60 mL/min (ref >60)
GFR calc non Af Amer: >60 mL/min (ref >60)
Potassium: 3.7 mEq/L (ref 3.5–5.1)
Potassium: 3.8 mEq/L (ref 3.5–5.1)
Sodium: 142 mEq/L (ref 135–145)

## 2010-07-17 LAB — CBC
HCT: 35.8 % — ABNORMAL LOW (ref 39.0–52.0)
HCT: 39.2 % (ref 39.0–52.0)
Hemoglobin: 11.9 g/dL — ABNORMAL LOW (ref 13.0–17.0)
Hemoglobin: 12.7 g/dL — ABNORMAL LOW (ref 13.0–17.0)
MCHC: 32.3 g/dL (ref 30.0–36.0)
MCV: 88.9 fL (ref 78.0–100.0)
MCV: 89 fL (ref 78.0–100.0)
MCV: 89.4 fL (ref 78.0–100.0)
Platelets: 209 10*3/uL (ref 150–400)
Platelets: 210 10*3/uL (ref 150–400)
Platelets: 214 10*3/uL (ref 150–400)
RBC: 3.94 MIL/uL — ABNORMAL LOW (ref 4.22–5.81)
RBC: 4.03 MIL/uL — ABNORMAL LOW (ref 4.22–5.81)
RBC: 4.39 MIL/uL (ref 4.22–5.81)
RDW: 15.6 % — ABNORMAL HIGH (ref 11.5–15.5)
WBC: 2.8 10*3/uL — ABNORMAL LOW (ref 4.0–10.5)
WBC: 2.8 10*3/uL — ABNORMAL LOW (ref 4.0–10.5)
WBC: 2.9 10*3/uL — ABNORMAL LOW (ref 4.0–10.5)
WBC: 2.9 K/uL — ABNORMAL LOW (ref 4.0–10.5)

## 2010-07-17 LAB — STOOL CULTURE

## 2010-07-17 LAB — TSH: TSH: 1.074 u[IU]/mL (ref 0.350–4.500)

## 2010-07-17 LAB — OVA AND PARASITE EXAMINATION: Ova and parasites: NONE SEEN

## 2010-07-17 LAB — VITAMIN B12: Vitamin B-12: 434 pg/mL (ref 211–911)

## 2010-07-17 LAB — FECAL LACTOFERRIN, QUANT: Fecal Lactoferrin: POSITIVE

## 2010-07-17 LAB — LIPASE, BLOOD: Lipase: 12 U/L (ref 11–59)

## 2010-07-17 LAB — LACTIC ACID, PLASMA: Lactic Acid, Venous: 1.5 mmol/L (ref 0.5–2.2)

## 2010-07-20 LAB — COMPREHENSIVE METABOLIC PANEL
AST: 14 U/L (ref 0–37)
Albumin: 2.6 g/dL — ABNORMAL LOW (ref 3.5–5.2)
Alkaline Phosphatase: 87 U/L (ref 39–117)
BUN: 8 mg/dL (ref 6–23)
CO2: 31 mEq/L (ref 19–32)
Chloride: 104 mEq/L (ref 96–112)
GFR calc Af Amer: >60 mL/min (ref >60)
Potassium: 3.7 mEq/L (ref 3.5–5.1)
Total Bilirubin: 0.5 mg/dL (ref 0.3–1.2)

## 2010-07-20 LAB — CBC
HCT: 34 % — ABNORMAL LOW (ref 39.0–52.0)
HCT: 34.5 % — ABNORMAL LOW (ref 39.0–52.0)
Hemoglobin: 11.1 g/dL — ABNORMAL LOW (ref 13.0–17.0)
Hemoglobin: 11.4 g/dL — ABNORMAL LOW (ref 13.0–17.0)
Hemoglobin: 11.5 g/dL — ABNORMAL LOW (ref 13.0–17.0)
MCHC: 33.6 g/dL (ref 30.0–36.0)
MCHC: 33.8 g/dL (ref 30.0–36.0)
MCHC: 33.5 g/dL (ref 30.0–36.0)
MCV: 85.7 fL (ref 78.0–100.0)
MCV: 86.7 fL (ref 78.0–100.0)
MCV: 87.6 fL (ref 78.0–100.0)
Platelets: 272 10*3/uL (ref 150–400)
RBC: 3.81 MIL/uL — ABNORMAL LOW (ref 4.22–5.81)
RBC: 3.97 MIL/uL — ABNORMAL LOW (ref 4.22–5.81)
RDW: 14.1 % (ref 11.5–15.5)
RDW: 14.1 % (ref 11.5–15.5)
WBC: 4.4 10*3/uL (ref 4.0–10.5)
WBC: 4.9 10*3/uL (ref 4.0–10.5)

## 2010-07-20 LAB — URINE MICROSCOPIC-ADD ON

## 2010-07-20 LAB — URINALYSIS, ROUTINE W REFLEX MICROSCOPIC
Bilirubin Urine: NEGATIVE
Glucose, UA: NEGATIVE mg/dL
Hgb urine dipstick: NEGATIVE
Ketones, ur: NEGATIVE mg/dL
Ketones, ur: NEGATIVE mg/dL
Nitrite: NEGATIVE
Protein, ur: NEGATIVE mg/dL
Specific Gravity, Urine: 1.018 (ref 1.005–1.030)
Urobilinogen, UA: 1 mg/dL (ref 0.0–1.0)
pH: 6 (ref 5.0–8.0)

## 2010-07-20 LAB — BASIC METABOLIC PANEL
BUN: 11 mg/dL (ref 6–23)
CO2: 30 mEq/L (ref 19–32)
CO2: 31 mEq/L (ref 19–32)
Chloride: 101 mEq/L (ref 96–112)
Chloride: 104 mEq/L (ref 96–112)
Creatinine, Ser: 0.8 mg/dL (ref 0.4–1.5)
GFR calc Af Amer: >60 mL/min (ref >60)
Glucose, Bld: 90 mg/dL (ref 70–99)
Potassium: 3.5 mEq/L (ref 3.5–5.1)
Potassium: 3.5 mEq/L (ref 3.5–5.1)
Sodium: 139 mEq/L (ref 135–145)
Sodium: 142 mEq/L (ref 135–145)

## 2010-07-20 LAB — GLUCOSE, CAPILLARY: Glucose-Capillary: 140 mg/dL — ABNORMAL HIGH (ref 70–99)

## 2010-07-20 LAB — DIFFERENTIAL
Basophils Absolute: 0 10*3/uL (ref 0.0–0.1)
Basophils Absolute: 0 10*3/uL (ref 0.0–0.1)
Basophils Relative: 1 % (ref 0–1)
Eosinophils Absolute: 0.2 10*3/uL (ref 0.0–0.7)
Eosinophils Relative: 3 % (ref 0–5)
Lymphocytes Relative: 17 % (ref 12–46)
Monocytes Absolute: 0.6 10*3/uL (ref 0.1–1.0)
Neutro Abs: 3.4 10*3/uL (ref 1.7–7.7)
Neutrophils Relative %: 70 % (ref 43–77)

## 2010-07-20 LAB — WOUND CULTURE

## 2010-07-20 LAB — URINE CULTURE
Colony Count: NO GROWTH
Culture: NO GROWTH

## 2010-07-20 LAB — SEDIMENTATION RATE
Sed Rate: 33 mm/hr — ABNORMAL HIGH (ref 0–16)
Sed Rate: 73 mm/hr — ABNORMAL HIGH (ref 0–16)

## 2010-08-22 NOTE — Discharge Summary (Signed)
[name], [name]               ACCOUNT NO.:  [id number]   MEDICAL RECORD NO.:  [id number]          PATIENT TYPE:  INP   LOCATION:  1410                         FACILITY:  [facility]   PHYSICIAN:  Corwin Levins, MD      DATE OF BIRTH:  1933-12-29   DATE OF ADMISSION:  09/30/2008  DATE OF DISCHARGE:                               DISCHARGE SUMMARY   ANTICIPATED DATE OF DISCHARGE:  October 03, 2008.   CONSULTATION:  Gastroenterology, Bernette Redbird, M.D.   PROCEDURES:  CT of the abdomen and pelvis September 30, 2008, with impression  of status post thoracic aortic stenting and findings in the pelvis  consistent with stercoral colitis.   HISTORY AND PHYSICAL:  See that dictated date of admission.   HOSPITAL COURSE:  [name] is a very nice 77 year old African American  male who presented with diarrhea and obstipation with stercoral colitis  on CT.  He was admitted for further evaluation and bowel treatment  consisting initially of MiraLax and Fleet enema.  C. diff was obtained  due to some loose stool related apparently and C. diff x2 were negative.  Gastroenterology was consulted per Dr. Matthias Hughs with the impression of  fecal impaction too proximal to reach on rectal exam possibly secondary  to nerve damage and/or opioid pain medications.  The recommendation was  for enemas and stool softeners and consideration for flexible  sigmoidoscopy or colonoscopy to evaluate once cleaned out to rule out  ulceration or ischemia.  He will also need ongoing stool softeners after  discharge.  The patient himself had no abdominal discomfort throughout  this hospitalization.  Dr. Matthias Hughs recommended attacking the fetal  obstipation from above and he was treated with significant amounts of  NuLYTELY.  On June 26 the KUB abdominal film showed retained stool and  NuLYTELY was continued.  On June 26 there were multiple large bowel  movements.  He was started on solid foods as well as maintenance MiraLax  with a  recommendation for followup KUB the morning of June 27.  June 27  KUB is pending at the time of this dictation.  The patient himself is  afebrile, taking p.o. well including solid food, tolerated the MiraLax.  No abdominal pain or fever.  He has been ambulatory and it is felt that  at the time of this dictation he may be amenable to discharge pending GI  recommendation for further evaluation and treatment.  This dictation was  done in anticipation of this possibility.  Also potassium was slightly  low and replaced p.o. due to the diarrhea.  There was some initial  bradycardia as well and metoprolol was decreased to 25 mg b.i.d.  He was  also begun on ciprofloxacin for possible urinary tract infection on the  date of admission.  Urine culture at the time of discharge shows  preliminary no growth as of the day of discharge.  The patient, however,  seemed to improve with the antibiotic symptomatically with mild  frequency.  It was also noted that he had persistent leukopenia which is  chronic for him with a  discharge white blood cell count of 2.8,  hemoglobin 11.90, MCV 89.0.  Also done the day of anticipated discharge  potassium 3.7, BUN 4, creatinine 0.73.  Iron and B12 determinations were  pending at time of discharge, stool studies such as ova and parasite  exam were negative.  Stool culture preliminary no growth to date and not  yet finalized.  Given these conditions it is felt the patient may be  approaching anticipated discharge later today on could be later if  gastroenterology feels he needs flexible sigmoidoscopy or colonoscopy.  Due to the marked hypertension upon admission Norvasc 10 mg was added  which he tolerated well with discharge blood pressure of 152/73.   DISPOSITION:  Possible discharge later today to home pending  gastrointestinal evaluation as above and followup KUB still pending at  time of this dictation.  He is to follow up with his primary care  physician, Dr. Darryll Capers, in 1-2 weeks.  GI followup will likely be  as needed.   DISCHARGE MEDICATIONS:  1. Will include MiraLax 17 grams in water p.o. daily.  2. Metoprolol 25 mg p.o. b.i.d.  3. Allopurinol 100 mg p.o.  4. Zinc 1 per day.  5. Neurontin 300 mg t.i.d.  6. Wellbutrin XL 300 mg p.o. daily.  7. Terazosin 0.4 mg p.o. daily.  8. Ambien as needed at night.  9. Ciprofloxacin 500 mg p.o. b.i.d. for 7 days.  10.Norvasc 10 mg.      Corwin Levins, MD  Electronically Signed     JWJ/MEDQ  D:  10/03/2008  T:  10/03/2008  Job:  [id number]

## 2010-08-22 NOTE — Op Note (Signed)
NAMEAZAHEL, BELCASTRO               ACCOUNT NO.:  [id number]   MEDICAL RECORD NO.:  [id number]          PATIENT TYPE:  INP   LOCATION:  2112                         FACILITY:  [facility]   PHYSICIAN:  Cherylynn Ridges, M.D.    DATE OF BIRTH:  Jun 23, 1933   DATE OF PROCEDURE:  03/17/2008  DATE OF DISCHARGE:                               OPERATIVE REPORT   PREOPERATIVE DIAGNOSIS:  Whiteout of right lung with respiratory  insufficiency.   POSTOPERATIVE DIAGNOSIS:  Secretions in both lung fields, right greater  than left with hyperemic and swollen right airways.   PROCEDURES:  1. Controlled rapid sequence induction, endotracheal intubation.  2. Bronchoscopy with lavage.   SURGEON:  Cherylynn Ridges, MD   ANESTHESIA:  He was given etomidate, succinylcholine, Norcuron, and  Versed throughout the procedure.   CONDITION:  Stable.  Chest x-ray is pending.   PROCEDURE IN DETAIL:  The patient was down in the bed in 2100 ICU.  We  initially did an rapid sequence induction with etomidate and  succinylcholine and placed in an #8 endotracheal tube down to  approximately 26 cm.  Once the tube was taped, we did a bronchoscopy  with lavage using Olympus bronchoscope.  The endotracheal tube was at  the level of the carina and the respiratory therapist pulled back on it  as we performed our bronchoscopy.  We went down the left airway first  where we saw a large amount of saliva-type secretions in the airway, but  no plugging.  On the right side, there was not only saliva drainage but  also significant swelling and erythema of the airways.  A large amount  of the salivary secretions were removed from both the right and the left  side with the right side being somewhat greater.  Post-procedure, the  patient had excellent breath sounds bilaterally, although still somewhat  rhonchorous on the right side.  We did use 20 mL of saline and 2 mL of  Mucomyst during the procedure.  All counts were correct.  The  patient  was done in the ICU and remained there intubated with chest x-ray  pending.      Cherylynn Ridges, M.D.  Electronically Signed     JOW/MEDQ  D:  03/17/2008  T:  03/18/2008  Job:  [id number]
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
## 2010-08-25 NOTE — Op Note (Signed)
[facility]  Patient:    [name], [name] Visit Number: [id number] MRN: [id number]          Service Type: DSU Location: DAY Attending Physician:  Londell Moh Dictated by:   Jamison Neighbor, M.D. Proc. Date: 12/10/00 Admit Date:  12/10/2000   CC:         Stacie Glaze, M.D. [facility]   Operative Report  SERVICE:  Urology.  PREOPERATIVE DIAGNOSES:  Balanitis and phimosis.  POSTOPERATIVE DIAGNOSES:  Balanitis and phimosis.  PROCEDURE:  Circumcision.  SURGEON:  Jamison Neighbor, M.D.  ANESTHESIA:  General.  COMPLICATIONS:  None.  DRAINS:  None.  BRIEF HISTORY:  This 77 year old male has marked balanitis and the foreskin cannot be retracted due to phimosis.  There have been pigment changes with large areas of white fibrotic tissue.  The patient has requested an elective circumcision.  Patient understands the risks and benefits of the procedure and gave full and informed consent.  DESCRIPTION OF PROCEDURE:  After the successful induction of general anesthesia, the patient was placed in the supine position, prepped with Betadine and draped in the usual sterile fashion.  A circumferential incision was made 8 mm below the glans penis and a second circumferential incision was made on the outer penile skin at the proximal level of the glans.  Resultant skin bridge was undermined with Metzenbaum scissors, cross-clamped with a straight clamp and divided.  Electrocautery was used to remove the foreskin. Additional hemostasis was obtained with electrocautery.  Quadrant incisions of 4-0 chromic were placed, including a box stitch along the frenulum. Additional stitches were made to reconstruct the frenulum and to control the frenular area.  Interrupted sutures of 4-0 chromic were then used to complete the procedure.  Patient then had a block performed with Marcaine.  A dressing of Xeroform gauze, dry gauze and Coban was placed.  The patient tolerated  the procedure well and was taken to the recovery room in good condition.  He will be sent home with a prescription for Lorcet Plus and will return to see me in two weeks time. Dictated by:   Jamison Neighbor, M.D. Attending Physician:  Londell Moh DD:  12/10/00 TD:  12/10/00 Job: [id number]

## 2010-10-06 ENCOUNTER — Other Ambulatory Visit: Payer: Self-pay | Admitting: Family Medicine

## 2010-10-06 ENCOUNTER — Ambulatory Visit
Admission: RE | Admit: 2010-10-06 | Discharge: 2010-10-06 | Disposition: A | Discharge status: Home / Self Care | Payer: No Typology Code available for payment source | Source: Ambulatory Visit | Attending: Family Medicine | Admitting: Family Medicine

## 2010-10-06 DIAGNOSIS — W19XXXA Unspecified fall, initial encounter: Secondary | ICD-10-CM

## 2010-10-06 DIAGNOSIS — M546 Pain in thoracic spine: Secondary | ICD-10-CM

## 2010-10-12 ENCOUNTER — Ambulatory Visit
Admission: RE | Admit: 2010-10-12 | Discharge: 2010-10-12 | Disposition: A | Discharge status: Home / Self Care | Payer: No Typology Code available for payment source | Source: Ambulatory Visit | Attending: Family Medicine | Admitting: Family Medicine

## 2010-10-12 ENCOUNTER — Other Ambulatory Visit: Payer: Self-pay | Admitting: Family Medicine

## 2010-10-12 DIAGNOSIS — W19XXXA Unspecified fall, initial encounter: Secondary | ICD-10-CM

## 2010-10-12 DIAGNOSIS — R05 Cough: Secondary | ICD-10-CM

## 2010-10-12 DIAGNOSIS — R52 Pain, unspecified: Secondary | ICD-10-CM

## 2011-01-09 LAB — POCT I-STAT 3, ART BLOOD GAS (G3+)
Acid-base deficit: 2 mmol/L (ref 0.0–2.0)
Acid-base deficit: 3 mmol/L — ABNORMAL HIGH (ref 0.0–2.0)
Acid-base deficit: 5 mmol/L — ABNORMAL HIGH (ref 0.0–2.0)
Bicarbonate: 20.1 meq/L (ref 20.0–24.0)
Bicarbonate: 22.4 meq/L (ref 20.0–24.0)
Bicarbonate: 23.4 meq/L (ref 20.0–24.0)
Bicarbonate: 23.9 meq/L (ref 20.0–24.0)
O2 Saturation: 94 %
O2 Saturation: 96 %
O2 Saturation: 99 %
Patient temperature: 34.8
Patient temperature: 98.7
Patient temperature: 99
Patient temperature: 99.1
TCO2: 22 mmol/L (ref 0–100)
TCO2: 25 mmol/L (ref 0–100)
TCO2: 25 mmol/L (ref 0–100)
pCO2 arterial: 32.4 mmHg — ABNORMAL LOW (ref 35.0–45.0)
pCO2 arterial: 43.3 mmHg (ref 35.0–45.0)
pCO2 arterial: 44.7 mmHg (ref 35.0–45.0)
pH, Arterial: 7.361 (ref 7.350–7.450)
pH, Arterial: 7.389 (ref 7.350–7.450)
pH, Arterial: 7.411 (ref 7.350–7.450)
pO2, Arterial: 85 mmHg (ref 80.0–100.0)

## 2011-01-09 LAB — BASIC METABOLIC PANEL
BUN: 11 mg/dL (ref 6–23)
BUN: 12 mg/dL (ref 6–23)
BUN: 23 mg/dL (ref 6–23)
CO2: 26 mEq/L (ref 19–32)
CO2: 29 mEq/L (ref 19–32)
CO2: 31 mEq/L (ref 19–32)
Calcium: 6.5 mg/dL — ABNORMAL LOW (ref 8.4–10.5)
Calcium: 7.6 mg/dL — ABNORMAL LOW (ref 8.4–10.5)
Calcium: 7.9 mg/dL — ABNORMAL LOW (ref 8.4–10.5)
Calcium: 8.7 mg/dL (ref 8.4–10.5)
Chloride: 104 mEq/L (ref 96–112)
Chloride: 106 mEq/L (ref 96–112)
Chloride: 107 mEq/L (ref 96–112)
Chloride: 114 mEq/L — ABNORMAL HIGH (ref 96–112)
Chloride: 97 mEq/L (ref 96–112)
Creatinine, Ser: 0.82 mg/dL (ref 0.4–1.5)
Creatinine, Ser: 0.88 mg/dL (ref 0.4–1.5)
Creatinine, Ser: 0.94 mg/dL (ref 0.4–1.5)
Creatinine, Ser: 0.97 mg/dL (ref 0.4–1.5)
Creatinine, Ser: 0.98 mg/dL (ref 0.4–1.5)
Creatinine, Ser: 1.02 mg/dL (ref 0.4–1.5)
GFR calc Af Amer: >60 mL/min (ref >60)
GFR calc Af Amer: >60 mL/min (ref >60)
GFR calc Af Amer: >60 mL/min (ref >60)
GFR calc Af Amer: >60 mL/min (ref >60)
GFR calc Af Amer: >60 mL/min (ref >60)
GFR calc Af Amer: >60 mL/min (ref >60)
GFR calc non Af Amer: >60 mL/min (ref >60)
GFR calc non Af Amer: >60 mL/min (ref >60)
GFR calc non Af Amer: >60 mL/min (ref >60)
GFR calc non Af Amer: >60 mL/min (ref >60)
GFR calc non Af Amer: >60 mL/min (ref >60)
GFR calc non Af Amer: >60 mL/min (ref >60)
Glucose, Bld: 129 mg/dL — ABNORMAL HIGH (ref 70–99)
Glucose, Bld: 141 mg/dL — ABNORMAL HIGH (ref 70–99)
Glucose, Bld: 144 mg/dL — ABNORMAL HIGH (ref 70–99)
Glucose, Bld: 144 mg/dL — ABNORMAL HIGH (ref 70–99)
Glucose, Bld: 147 mg/dL — ABNORMAL HIGH (ref 70–99)
Potassium: 3.5 mEq/L (ref 3.5–5.1)
Potassium: 3.5 mEq/L (ref 3.5–5.1)
Potassium: 3.7 mEq/L (ref 3.5–5.1)
Potassium: 4.3 mEq/L (ref 3.5–5.1)
Sodium: 135 mEq/L (ref 135–145)
Sodium: 137 mEq/L (ref 135–145)
Sodium: 138 mEq/L (ref 135–145)
Sodium: 140 mEq/L (ref 135–145)

## 2011-01-09 LAB — TYPE AND SCREEN: Antibody Screen: NEGATIVE

## 2011-01-09 LAB — URINE CULTURE
Colony Count: NO GROWTH
Culture: NO GROWTH

## 2011-01-09 LAB — GLUCOSE, CAPILLARY
Glucose-Capillary: 112 mg/dL — ABNORMAL HIGH (ref 70–99)
Glucose-Capillary: 113 mg/dL — ABNORMAL HIGH (ref 70–99)
Glucose-Capillary: 121 mg/dL — ABNORMAL HIGH (ref 70–99)
Glucose-Capillary: 122 mg/dL — ABNORMAL HIGH (ref 70–99)
Glucose-Capillary: 125 mg/dL — ABNORMAL HIGH (ref 70–99)
Glucose-Capillary: 126 mg/dL — ABNORMAL HIGH (ref 70–99)
Glucose-Capillary: 129 mg/dL — ABNORMAL HIGH (ref 70–99)
Glucose-Capillary: 131 mg/dL — ABNORMAL HIGH (ref 70–99)
Glucose-Capillary: 131 mg/dL — ABNORMAL HIGH (ref 70–99)
Glucose-Capillary: 131 mg/dL — ABNORMAL HIGH (ref 70–99)
Glucose-Capillary: 132 mg/dL — ABNORMAL HIGH (ref 70–99)
Glucose-Capillary: 135 mg/dL — ABNORMAL HIGH (ref 70–99)
Glucose-Capillary: 141 mg/dL — ABNORMAL HIGH (ref 70–99)
Glucose-Capillary: 142 mg/dL — ABNORMAL HIGH (ref 70–99)
Glucose-Capillary: 143 mg/dL — ABNORMAL HIGH (ref 70–99)
Glucose-Capillary: 143 mg/dL — ABNORMAL HIGH (ref 70–99)
Glucose-Capillary: 143 mg/dL — ABNORMAL HIGH (ref 70–99)
Glucose-Capillary: 143 mg/dL — ABNORMAL HIGH (ref 70–99)
Glucose-Capillary: 147 mg/dL — ABNORMAL HIGH (ref 70–99)
Glucose-Capillary: 149 mg/dL — ABNORMAL HIGH (ref 70–99)
Glucose-Capillary: 156 mg/dL — ABNORMAL HIGH (ref 70–99)
Glucose-Capillary: 158 mg/dL — ABNORMAL HIGH (ref 70–99)
Glucose-Capillary: 168 mg/dL — ABNORMAL HIGH (ref 70–99)
Glucose-Capillary: 171 mg/dL — ABNORMAL HIGH (ref 70–99)
Glucose-Capillary: 171 mg/dL — ABNORMAL HIGH (ref 70–99)
Glucose-Capillary: 97 mg/dL (ref 70–99)

## 2011-01-09 LAB — POCT I-STAT EG7
Acid-base deficit: 1 mmol/L (ref 0.0–2.0)
Acid-base deficit: 3 mmol/L — ABNORMAL HIGH (ref 0.0–2.0)
Bicarbonate: 24.3 meq/L — ABNORMAL HIGH (ref 20.0–24.0)
Calcium, Ion: 1.17 mmol/L (ref 1.12–1.32)
HCT: 34 % — ABNORMAL LOW (ref 39.0–52.0)
O2 Saturation: 76 %
Potassium: 3.5 meq/L (ref 3.5–5.1)
Sodium: 138 meq/L (ref 135–145)
Sodium: 139 meq/L (ref 135–145)
TCO2: 26 mmol/L (ref 0–100)
pO2, Ven: 47 mmHg — ABNORMAL HIGH (ref 30.0–45.0)

## 2011-01-09 LAB — CBC
HCT: 31.1 % — ABNORMAL LOW (ref 39.0–52.0)
HCT: 31.8 % — ABNORMAL LOW (ref 39.0–52.0)
HCT: 34.8 % — ABNORMAL LOW (ref 39.0–52.0)
HCT: 35.4 % — ABNORMAL LOW (ref 39.0–52.0)
HCT: 36.1 % — ABNORMAL LOW (ref 39.0–52.0)
HCT: 40.4 % (ref 39.0–52.0)
HCT: 40.9 % (ref 39.0–52.0)
Hemoglobin: 10.4 g/dL — ABNORMAL LOW (ref 13.0–17.0)
Hemoglobin: 11 g/dL — ABNORMAL LOW (ref 13.0–17.0)
Hemoglobin: 11.3 g/dL — ABNORMAL LOW (ref 13.0–17.0)
Hemoglobin: 11.4 g/dL — ABNORMAL LOW (ref 13.0–17.0)
Hemoglobin: 11.5 g/dL — ABNORMAL LOW (ref 13.0–17.0)
Hemoglobin: 12.4 g/dL — ABNORMAL LOW (ref 13.0–17.0)
Hemoglobin: 12.5 g/dL — ABNORMAL LOW (ref 13.0–17.0)
MCHC: 32.7 g/dL (ref 30.0–36.0)
MCHC: 32.9 g/dL (ref 30.0–36.0)
MCHC: 33 g/dL (ref 30.0–36.0)
MCHC: 33.9 g/dL (ref 30.0–36.0)
MCHC: 34 g/dL (ref 30.0–36.0)
MCV: 87.8 fL (ref 78.0–100.0)
MCV: 87.9 fL (ref 78.0–100.0)
MCV: 88.9 fL (ref 78.0–100.0)
MCV: 89 fL (ref 78.0–100.0)
MCV: 89 fL (ref 78.0–100.0)
MCV: 89.9 fL (ref 78.0–100.0)
MCV: 90.2 fL (ref 78.0–100.0)
MCV: 90.5 fL (ref 78.0–100.0)
MCV: 90.8 fL (ref 78.0–100.0)
MCV: 90.8 fL (ref 78.0–100.0)
Platelets: 143 10*3/uL — ABNORMAL LOW (ref 150–400)
Platelets: 173 10*3/uL (ref 150–400)
Platelets: 295 10*3/uL (ref 150–400)
Platelets: 88 10*3/uL — ABNORMAL LOW (ref 150–400)
Platelets: 95 10*3/uL — ABNORMAL LOW (ref 150–400)
RBC: 3.09 MIL/uL — ABNORMAL LOW (ref 4.22–5.81)
RBC: 3.5 MIL/uL — ABNORMAL LOW (ref 4.22–5.81)
RBC: 3.7 MIL/uL — ABNORMAL LOW (ref 4.22–5.81)
RBC: 3.8 MIL/uL — ABNORMAL LOW (ref 4.22–5.81)
RBC: 3.84 MIL/uL — ABNORMAL LOW (ref 4.22–5.81)
RBC: 3.91 MIL/uL — ABNORMAL LOW (ref 4.22–5.81)
RBC: 4.06 MIL/uL — ABNORMAL LOW (ref 4.22–5.81)
RBC: 4.17 MIL/uL — ABNORMAL LOW (ref 4.22–5.81)
RBC: 4.19 MIL/uL — ABNORMAL LOW (ref 4.22–5.81)
RBC: 4.55 MIL/uL (ref 4.22–5.81)
RDW: 14.2 % (ref 11.5–15.5)
RDW: 14.5 % (ref 11.5–15.5)
RDW: 14.6 % (ref 11.5–15.5)
RDW: 14.6 % (ref 11.5–15.5)
RDW: 14.6 % (ref 11.5–15.5)
RDW: 14.7 % (ref 11.5–15.5)
RDW: 14.7 % (ref 11.5–15.5)
WBC: 11.2 10*3/uL — ABNORMAL HIGH (ref 4.0–10.5)
WBC: 4.8 10*3/uL (ref 4.0–10.5)
WBC: 5.6 10*3/uL (ref 4.0–10.5)
WBC: 6.1 10*3/uL (ref 4.0–10.5)
WBC: 6.3 10*3/uL (ref 4.0–10.5)
WBC: 7.3 10*3/uL (ref 4.0–10.5)
WBC: 8.4 10*3/uL (ref 4.0–10.5)
WBC: 9 10*3/uL (ref 4.0–10.5)

## 2011-01-09 LAB — CULTURE, BLOOD (ROUTINE X 2): Culture: NO GROWTH

## 2011-01-09 LAB — URINALYSIS, ROUTINE W REFLEX MICROSCOPIC
Bilirubin Urine: NEGATIVE
Bilirubin Urine: NEGATIVE
Ketones, ur: NEGATIVE mg/dL
Ketones, ur: NEGATIVE mg/dL
Nitrite: NEGATIVE
Nitrite: NEGATIVE
Protein, ur: NEGATIVE mg/dL
Urobilinogen, UA: 4 mg/dL — ABNORMAL HIGH (ref 0.0–1.0)
pH: 7 (ref 5.0–8.0)

## 2011-01-09 LAB — POCT CARDIAC MARKERS
CKMB, poc: 3.5 ng/mL (ref 1.0–8.0)
Troponin i, poc: <0.05 ng/mL (ref 0.00–0.09)

## 2011-01-09 LAB — POCT I-STAT, CHEM 8
BUN: 18 mg/dL (ref 6–23)
Calcium, Ion: 1.03 mmol/L — ABNORMAL LOW (ref 1.12–1.32)
Chloride: 113 meq/L — ABNORMAL HIGH (ref 96–112)
Sodium: 144 meq/L (ref 135–145)

## 2011-01-09 LAB — PREPARE RBC (CROSSMATCH)

## 2011-01-09 LAB — PREPARE FRESH FROZEN PLASMA

## 2011-01-09 LAB — COMPREHENSIVE METABOLIC PANEL
ALT: 130 U/L — ABNORMAL HIGH (ref 0–53)
AST: 42 U/L — ABNORMAL HIGH (ref 0–37)
Albumin: 2 g/dL — ABNORMAL LOW (ref 3.5–5.2)
Alkaline Phosphatase: 195 U/L — ABNORMAL HIGH (ref 39–117)
Calcium: 7.3 mg/dL — ABNORMAL LOW (ref 8.4–10.5)
Creatinine, Ser: 0.94 mg/dL (ref 0.4–1.5)
GFR calc Af Amer: >60 mL/min (ref >60)
Glucose, Bld: 146 mg/dL — ABNORMAL HIGH (ref 70–99)
Potassium: 4 mEq/L (ref 3.5–5.1)
Sodium: 136 mEq/L (ref 135–145)
Total Protein: 5 g/dL — ABNORMAL LOW (ref 6.0–8.3)

## 2011-01-09 LAB — DIFFERENTIAL
Eosinophils Absolute: 0 10*3/uL (ref 0.0–0.7)
Eosinophils Absolute: 0.2 10*3/uL (ref 0.0–0.7)
Eosinophils Relative: 0 % (ref 0–5)
Eosinophils Relative: 3 % (ref 0–5)
Lymphs Abs: 0.4 10*3/uL — ABNORMAL LOW (ref 0.7–4.0)
Lymphs Abs: 1.1 10*3/uL (ref 0.7–4.0)
Monocytes Absolute: 0.3 10*3/uL (ref 0.1–1.0)
Monocytes Relative: 7 % (ref 3–12)
Monocytes Relative: 8 % (ref 3–12)

## 2011-01-09 LAB — RAPID URINE DRUG SCREEN, HOSP PERFORMED
Amphetamines: NOT DETECTED
Barbiturates: NOT DETECTED
Opiates: NOT DETECTED

## 2011-01-09 LAB — AMMONIA
Ammonia: 16 umol/L (ref 11–35)
Ammonia: 51 umol/L — ABNORMAL HIGH (ref 11–35)

## 2011-01-09 LAB — URINE MICROSCOPIC-ADD ON

## 2011-01-09 LAB — CULTURE, BAL-QUANTITATIVE W GRAM STAIN

## 2011-01-09 LAB — ABO/RH: ABO/RH(D): O POS

## 2011-01-09 LAB — APTT: aPTT: 30 seconds (ref 24–37)

## 2011-01-12 LAB — GLUCOSE, CAPILLARY
Glucose-Capillary: 101 mg/dL — ABNORMAL HIGH (ref 70–99)
Glucose-Capillary: 103 mg/dL — ABNORMAL HIGH (ref 70–99)
Glucose-Capillary: 104 mg/dL — ABNORMAL HIGH (ref 70–99)
Glucose-Capillary: 111 mg/dL — ABNORMAL HIGH (ref 70–99)
Glucose-Capillary: 129 mg/dL — ABNORMAL HIGH (ref 70–99)
Glucose-Capillary: 190 mg/dL — ABNORMAL HIGH (ref 70–99)
Glucose-Capillary: 97 mg/dL (ref 70–99)

## 2011-01-12 LAB — BASIC METABOLIC PANEL
BUN: 17 mg/dL (ref 6–23)
BUN: 18 mg/dL (ref 6–23)
CO2: 26 mEq/L (ref 19–32)
CO2: 28 mEq/L (ref 19–32)
CO2: 28 mEq/L (ref 19–32)
CO2: 29 mEq/L (ref 19–32)
CO2: 31 mEq/L (ref 19–32)
Calcium: 7.9 mg/dL — ABNORMAL LOW (ref 8.4–10.5)
Calcium: 8.5 mg/dL (ref 8.4–10.5)
Chloride: 101 mEq/L (ref 96–112)
Chloride: 102 mEq/L (ref 96–112)
Chloride: 103 mEq/L (ref 96–112)
Chloride: 105 mEq/L (ref 96–112)
Chloride: 96 mEq/L (ref 96–112)
Creatinine, Ser: 0.77 mg/dL (ref 0.4–1.5)
Creatinine, Ser: 0.79 mg/dL (ref 0.4–1.5)
Creatinine, Ser: 0.88 mg/dL (ref 0.4–1.5)
Creatinine, Ser: 0.89 mg/dL (ref 0.4–1.5)
Creatinine, Ser: 0.92 mg/dL (ref 0.4–1.5)
GFR calc Af Amer: >60 mL/min (ref >60)
GFR calc Af Amer: >60 mL/min (ref >60)
GFR calc Af Amer: >60 mL/min (ref >60)
GFR calc Af Amer: >60 mL/min (ref >60)
GFR calc non Af Amer: >60 mL/min (ref >60)
Glucose, Bld: 107 mg/dL — ABNORMAL HIGH (ref 70–99)
Glucose, Bld: 122 mg/dL — ABNORMAL HIGH (ref 70–99)
Glucose, Bld: 126 mg/dL — ABNORMAL HIGH (ref 70–99)
Glucose, Bld: 138 mg/dL — ABNORMAL HIGH (ref 70–99)
Potassium: 3.1 mEq/L — ABNORMAL LOW (ref 3.5–5.1)
Potassium: 3.3 mEq/L — ABNORMAL LOW (ref 3.5–5.1)
Potassium: 3.9 mEq/L (ref 3.5–5.1)
Potassium: 4.5 mEq/L (ref 3.5–5.1)
Sodium: 130 mEq/L — ABNORMAL LOW (ref 135–145)
Sodium: 135 mEq/L (ref 135–145)
Sodium: 138 mEq/L (ref 135–145)
Sodium: 139 mEq/L (ref 135–145)

## 2011-01-12 LAB — CBC
HCT: 35 % — ABNORMAL LOW (ref 39.0–52.0)
HCT: 37.5 % — ABNORMAL LOW (ref 39.0–52.0)
Hemoglobin: 11.7 g/dL — ABNORMAL LOW (ref 13.0–17.0)
Hemoglobin: 12.5 g/dL — ABNORMAL LOW (ref 13.0–17.0)
MCHC: 32.9 g/dL (ref 30.0–36.0)
MCHC: 33 g/dL (ref 30.0–36.0)
MCHC: 33.1 g/dL (ref 30.0–36.0)
MCHC: 33.4 g/dL (ref 30.0–36.0)
MCHC: 34 g/dL (ref 30.0–36.0)
MCV: 89.3 fL (ref 78.0–100.0)
MCV: 90.6 fL (ref 78.0–100.0)
Platelets: 298 10*3/uL (ref 150–400)
Platelets: 326 10*3/uL (ref 150–400)
Platelets: 326 10*3/uL (ref 150–400)
Platelets: 433 10*3/uL — ABNORMAL HIGH (ref 150–400)
RBC: 3.8 MIL/uL — ABNORMAL LOW (ref 4.22–5.81)
RBC: 3.91 MIL/uL — ABNORMAL LOW (ref 4.22–5.81)
RBC: 4.02 MIL/uL — ABNORMAL LOW (ref 4.22–5.81)
RBC: 4.74 MIL/uL (ref 4.22–5.81)
RDW: 14.6 % (ref 11.5–15.5)
RDW: 14.7 % (ref 11.5–15.5)
WBC: 11.1 10*3/uL — ABNORMAL HIGH (ref 4.0–10.5)
WBC: 11.4 10*3/uL — ABNORMAL HIGH (ref 4.0–10.5)
WBC: 11.5 10*3/uL — ABNORMAL HIGH (ref 4.0–10.5)
WBC: 12.2 10*3/uL — ABNORMAL HIGH (ref 4.0–10.5)
WBC: 14.6 10*3/uL — ABNORMAL HIGH (ref 4.0–10.5)

## 2011-01-12 LAB — URINALYSIS, ROUTINE W REFLEX MICROSCOPIC
Glucose, UA: NEGATIVE mg/dL
Protein, ur: NEGATIVE mg/dL
Specific Gravity, Urine: 1.019 (ref 1.005–1.030)
Urobilinogen, UA: 2 mg/dL — ABNORMAL HIGH (ref 0.0–1.0)

## 2011-01-12 LAB — BLOOD GAS, ARTERIAL
FIO2: 1 %
O2 Saturation: 99.5 %
pCO2 arterial: 38.3 mmHg (ref 35.0–45.0)
pO2, Arterial: 157 mmHg — ABNORMAL HIGH (ref 80.0–100.0)

## 2011-01-12 LAB — MAGNESIUM: Magnesium: 2.1 mg/dL (ref 1.5–2.5)

## 2011-01-12 LAB — VANCOMYCIN, TROUGH
Vancomycin Tr: 8.2 ug/mL — ABNORMAL LOW (ref 10.0–20.0)
Vancomycin Tr: 9.6 ug/mL — ABNORMAL LOW (ref 10.0–20.0)

## 2011-01-12 LAB — CULTURE, RESPIRATORY W GRAM STAIN: Gram Stain: NONE SEEN

## 2011-01-12 LAB — URINE CULTURE

## 2011-01-12 LAB — EXPECTORATED SPUTUM ASSESSMENT W GRAM STAIN, RFLX TO RESP C

## 2011-01-12 LAB — CULTURE, BLOOD (ROUTINE X 2)

## 2011-01-12 LAB — DIFFERENTIAL
Basophils Absolute: 0.1 10*3/uL (ref 0.0–0.1)
Basophils Relative: 1 % (ref 0–1)
Eosinophils Absolute: 0.1 10*3/uL (ref 0.0–0.7)
Neutrophils Relative %: 86 % — ABNORMAL HIGH (ref 43–77)

## 2011-01-12 LAB — CARDIAC PANEL(CRET KIN+CKTOT+MB+TROPI): Total CK: 118 U/L (ref 7–232)

## 2011-01-16 LAB — CBC
HCT: 37.5 — ABNORMAL LOW
Hemoglobin: 12.2 — ABNORMAL LOW
MCHC: 32.6
MCV: 87.3
RBC: 4.3
WBC: 4.5

## 2011-01-16 LAB — DIFFERENTIAL
Basophils Relative: 0
Eosinophils Absolute: 0.2
Lymphs Abs: 1.3
Monocytes Absolute: 0.3
Monocytes Relative: 6
Neutrophils Relative %: 59

## 2011-01-16 LAB — I-STAT 8, (EC8 V) (CONVERTED LAB)
BUN: 17
Chloride: 106
HCT: 42
Hemoglobin: 14.3
Potassium: 3.9
pCO2, Ven: 44.3 — ABNORMAL LOW
pH, Ven: 7.375 — ABNORMAL HIGH

## 2011-01-16 LAB — POCT CARDIAC MARKERS
CKMB, poc: 1.1
Myoglobin, poc: 82.6
Operator id: 272551
Operator id: 284251

## 2011-01-16 LAB — POCT I-STAT CREATININE: Creatinine, Ser: 1.5

## 2011-01-22 LAB — I-STAT 8, (EC8 V) (CONVERTED LAB)
BUN: 19
Chloride: 108
Glucose, Bld: 86
HCT: 40
Potassium: 3.8
pH, Ven: 7.293

## 2011-01-22 LAB — DIFFERENTIAL
Basophils Relative: 1
Eosinophils Absolute: 0.3
Eosinophils Relative: 9 — ABNORMAL HIGH
Lymphs Abs: 1.5
Monocytes Relative: 9

## 2011-01-22 LAB — CBC
HCT: 35.5 — ABNORMAL LOW
MCHC: 32.5
MCV: 85.4
RBC: 4.16 — ABNORMAL LOW
WBC: 3.4 — ABNORMAL LOW

## 2011-04-04 ENCOUNTER — Inpatient Hospital Stay (HOSPITAL_COMMUNITY)
Admission: EM | Admit: 2011-04-04 | Discharge: 2011-04-08 | DRG: 392 | Disposition: A | Discharge status: Home / Self Care | Payer: Medicare (Managed Care) | Source: Ambulatory Visit | Attending: Internal Medicine | Admitting: Internal Medicine

## 2011-04-04 ENCOUNTER — Emergency Department (HOSPITAL_COMMUNITY): Payer: Medicare (Managed Care)

## 2011-04-04 ENCOUNTER — Encounter: Payer: Self-pay | Admitting: Emergency Medicine

## 2011-04-04 ENCOUNTER — Other Ambulatory Visit: Payer: Self-pay

## 2011-04-04 DIAGNOSIS — Z888 Allergy status to other drugs, medicaments and biological substances status: Secondary | ICD-10-CM

## 2011-04-04 DIAGNOSIS — R197 Diarrhea, unspecified: Secondary | ICD-10-CM | POA: Diagnosis present

## 2011-04-04 DIAGNOSIS — B9689 Other specified bacterial agents as the cause of diseases classified elsewhere: Secondary | ICD-10-CM | POA: Diagnosis present

## 2011-04-04 DIAGNOSIS — Z79899 Other long term (current) drug therapy: Secondary | ICD-10-CM

## 2011-04-04 DIAGNOSIS — K529 Noninfective gastroenteritis and colitis, unspecified: Secondary | ICD-10-CM | POA: Diagnosis present

## 2011-04-04 DIAGNOSIS — G8929 Other chronic pain: Secondary | ICD-10-CM | POA: Diagnosis present

## 2011-04-04 DIAGNOSIS — E278 Other specified disorders of adrenal gland: Secondary | ICD-10-CM | POA: Diagnosis present

## 2011-04-04 DIAGNOSIS — F3289 Other specified depressive episodes: Secondary | ICD-10-CM | POA: Diagnosis present

## 2011-04-04 DIAGNOSIS — N390 Urinary tract infection, site not specified: Secondary | ICD-10-CM | POA: Diagnosis present

## 2011-04-04 DIAGNOSIS — K279 Peptic ulcer, site unspecified, unspecified as acute or chronic, without hemorrhage or perforation: Secondary | ICD-10-CM | POA: Diagnosis present

## 2011-04-04 DIAGNOSIS — F329 Major depressive disorder, single episode, unspecified: Secondary | ICD-10-CM | POA: Diagnosis present

## 2011-04-04 DIAGNOSIS — M6281 Muscle weakness (generalized): Secondary | ICD-10-CM | POA: Diagnosis present

## 2011-04-04 DIAGNOSIS — R1110 Vomiting, unspecified: Secondary | ICD-10-CM

## 2011-04-04 DIAGNOSIS — N210 Calculus in bladder: Secondary | ICD-10-CM | POA: Diagnosis present

## 2011-04-04 DIAGNOSIS — Z993 Dependence on wheelchair: Secondary | ICD-10-CM

## 2011-04-04 DIAGNOSIS — F411 Generalized anxiety disorder: Secondary | ICD-10-CM | POA: Diagnosis present

## 2011-04-04 DIAGNOSIS — M549 Dorsalgia, unspecified: Secondary | ICD-10-CM | POA: Diagnosis present

## 2011-04-04 DIAGNOSIS — R1084 Generalized abdominal pain: Secondary | ICD-10-CM | POA: Diagnosis present

## 2011-04-04 DIAGNOSIS — I10 Essential (primary) hypertension: Secondary | ICD-10-CM | POA: Diagnosis present

## 2011-04-04 DIAGNOSIS — M1990 Unspecified osteoarthritis, unspecified site: Secondary | ICD-10-CM | POA: Diagnosis present

## 2011-04-04 DIAGNOSIS — K5289 Other specified noninfective gastroenteritis and colitis: Principal | ICD-10-CM | POA: Diagnosis present

## 2011-04-04 DIAGNOSIS — J9819 Other pulmonary collapse: Secondary | ICD-10-CM | POA: Diagnosis present

## 2011-04-04 DIAGNOSIS — R109 Unspecified abdominal pain: Secondary | ICD-10-CM | POA: Diagnosis present

## 2011-04-04 DIAGNOSIS — E860 Dehydration: Secondary | ICD-10-CM | POA: Diagnosis present

## 2011-04-04 HISTORY — DX: Unspecified osteoarthritis, unspecified site: M19.90

## 2011-04-04 HISTORY — DX: Reserved for inherently not codable concepts without codable children: IMO0001

## 2011-04-04 HISTORY — DX: Pneumonia, unspecified organism: J18.9

## 2011-04-04 HISTORY — DX: Essential (primary) hypertension: I10

## 2011-04-04 HISTORY — DX: Encounter for other specified aftercare: Z51.89

## 2011-04-04 LAB — COMPREHENSIVE METABOLIC PANEL
ALT: 20 U/L (ref 0–53)
Alkaline Phosphatase: 90 U/L (ref 39–117)
CO2: 28 mEq/L (ref 19–32)
Calcium: 8.5 mg/dL (ref 8.4–10.5)
GFR calc Af Amer: 60 mL/min — ABNORMAL LOW (ref >90)
GFR calc non Af Amer: 52 mL/min — ABNORMAL LOW (ref >90)
Glucose, Bld: 93 mg/dL (ref 70–99)
Sodium: 143 mEq/L (ref 135–145)

## 2011-04-04 LAB — CBC
HCT: 45 % (ref 39.0–52.0)
Hemoglobin: 15 g/dL (ref 13.0–17.0)
MCV: 85.4 fL (ref 78.0–100.0)
Platelets: 230 10*3/uL (ref 150–400)
RBC: 5.27 MIL/uL (ref 4.22–5.81)
WBC: 7.9 10*3/uL (ref 4.0–10.5)

## 2011-04-04 LAB — DIFFERENTIAL
Eosinophils Relative: 1 % (ref 0–5)
Lymphocytes Relative: 19 % (ref 12–46)
Lymphs Abs: 1.5 10*3/uL (ref 0.7–4.0)
Monocytes Relative: 15 % — ABNORMAL HIGH (ref 3–12)

## 2011-04-04 MED ORDER — SODIUM CHLORIDE 0.9 % IV BOLUS (SEPSIS)
1000.0000 mL | Freq: Once | INTRAVENOUS | Status: AC
  Administered 2011-04-04: 1000 mL via INTRAVENOUS
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
## 2011-04-04 NOTE — ED Notes (Signed)
Patient back from X-Ray.

## 2011-04-04 NOTE — ED Notes (Signed)
Patient had an episode of watery diarrhea. Patient cleaned and linens changed

## 2011-04-04 NOTE — ED Notes (Signed)
Pt has been having diarrhea since this AM. Pt is coming from home and was given Rx OTC for this without any success. Denies any abd pain. Vomiting x3 today

## 2011-04-05 ENCOUNTER — Encounter (HOSPITAL_COMMUNITY): Payer: Self-pay | Admitting: Internal Medicine

## 2011-04-05 ENCOUNTER — Emergency Department (HOSPITAL_COMMUNITY): Payer: Medicare (Managed Care)

## 2011-04-05 LAB — CBC
HCT: 41.4 % (ref 39.0–52.0)
MCH: 27.8 pg (ref 26.0–34.0)
MCHC: 32.9 g/dL (ref 30.0–36.0)
MCV: 84.7 fL (ref 78.0–100.0)
RDW: 14.6 % (ref 11.5–15.5)

## 2011-04-05 LAB — URINALYSIS, ROUTINE W REFLEX MICROSCOPIC
Glucose, UA: NEGATIVE mg/dL
Hgb urine dipstick: NEGATIVE
Ketones, ur: NEGATIVE mg/dL
Protein, ur: NEGATIVE mg/dL

## 2011-04-05 LAB — URINE MICROSCOPIC-ADD ON

## 2011-04-05 MED ORDER — KCL IN DEXTROSE-NACL 20-5-0.9 MEQ/L-%-% IV SOLN
INTRAVENOUS | Status: DC
  Administered 2011-04-05 – 2011-04-07 (×6): via INTRAVENOUS
  Filled 2011-04-05 (×10): qty 1000

## 2011-04-05 MED ORDER — BUPROPION HCL ER (XL) 300 MG PO TB24
300.0000 mg | ORAL_TABLET | Freq: Every day | ORAL | Status: DC
  Administered 2011-04-05 – 2011-04-08 (×4): 300 mg via ORAL
  Filled 2011-04-05 (×4): qty 1

## 2011-04-05 MED ORDER — DIAZEPAM 5 MG PO TABS
5.0000 mg | ORAL_TABLET | Freq: Two times a day (BID) | ORAL | Status: DC | PRN
  Administered 2011-04-07 (×2): 5 mg via ORAL
  Filled 2011-04-05 (×2): qty 1

## 2011-04-05 MED ORDER — IOHEXOL 300 MG/ML  SOLN
80.0000 mL | Freq: Once | INTRAMUSCULAR | Status: AC | PRN
  Administered 2011-04-05: 80 mL via INTRAVENOUS

## 2011-04-05 MED ORDER — AMLODIPINE BESYLATE 5 MG PO TABS
5.0000 mg | ORAL_TABLET | Freq: Every day | ORAL | Status: DC
  Administered 2011-04-06 – 2011-04-08 (×3): 5 mg via ORAL
  Filled 2011-04-05 (×3): qty 1

## 2011-04-05 MED ORDER — SODIUM CHLORIDE 0.9 % IV SOLN
Freq: Once | INTRAVENOUS | Status: AC
  Administered 2011-04-05: 04:00:00 via INTRAVENOUS

## 2011-04-05 MED ORDER — LOPERAMIDE HCL 2 MG PO CAPS
4.0000 mg | ORAL_CAPSULE | Freq: Once | ORAL | Status: AC
  Administered 2011-04-05: 4 mg via ORAL
  Filled 2011-04-05: qty 2

## 2011-04-05 MED ORDER — LOPERAMIDE HCL 2 MG PO CAPS
2.0000 mg | ORAL_CAPSULE | Freq: Four times a day (QID) | ORAL | Status: DC | PRN
Start: 2011-04-05 — End: 2011-04-06
  Filled 2011-04-05: qty 1

## 2011-04-05 MED ORDER — SODIUM CHLORIDE 0.9 % IV BOLUS (SEPSIS)
1000.0000 mL | Freq: Once | INTRAVENOUS | Status: AC
  Administered 2011-04-05: 1000 mL via INTRAVENOUS

## 2011-04-05 MED ORDER — MORPHINE SULFATE 2 MG/ML IJ SOLN: 2.0000 mg | INTRAMUSCULAR | Status: DC | PRN

## 2011-04-05 MED ORDER — LEVOFLOXACIN IN D5W 750 MG/150ML IV SOLN: 750.0000 mg | INTRAVENOUS | Status: DC

## 2011-04-05 MED ORDER — METRONIDAZOLE IN NACL 5-0.79 MG/ML-% IV SOLN
500.0000 mg | Freq: Once | INTRAVENOUS | Status: AC
  Administered 2011-04-05: 500 mg via INTRAVENOUS
  Filled 2011-04-05: qty 100

## 2011-04-05 MED ORDER — LISINOPRIL 40 MG PO TABS
40.0000 mg | ORAL_TABLET | Freq: Every day | ORAL | Status: DC
  Administered 2011-04-06 – 2011-04-08 (×3): 40 mg via ORAL
  Filled 2011-04-05 (×3): qty 1

## 2011-04-05 MED ORDER — LOPERAMIDE HCL 2 MG PO CAPS
2.0000 mg | ORAL_CAPSULE | ORAL | Status: DC | PRN
  Filled 2011-04-05: qty 1

## 2011-04-05 MED ORDER — LEVOFLOXACIN IN D5W 750 MG/150ML IV SOLN
750.0000 mg | INTRAVENOUS | Status: AC
  Administered 2011-04-05: 750 mg via INTRAVENOUS
  Filled 2011-04-05: qty 150

## 2011-04-05 MED ORDER — MORPHINE SULFATE 2 MG/ML IJ SOLN
2.0000 mg | Freq: Once | INTRAMUSCULAR | Status: DC
  Filled 2011-04-05: qty 1

## 2011-04-05 MED ORDER — IOHEXOL 300 MG/ML  SOLN: 40.0000 mL | Freq: Once | INTRAMUSCULAR | Status: AC | PRN

## 2011-04-05 MED ORDER — LEVOFLOXACIN IN D5W 750 MG/150ML IV SOLN
750.0000 mg | INTRAVENOUS | Status: DC
  Administered 2011-04-06 – 2011-04-08 (×3): 750 mg via INTRAVENOUS
  Filled 2011-04-05 (×4): qty 150

## 2011-04-05 MED ORDER — ENOXAPARIN SODIUM 40 MG/0.4ML ~~LOC~~ SOLN
40.0000 mg | SUBCUTANEOUS | Status: DC
  Administered 2011-04-05 – 2011-04-08 (×4): 40 mg via SUBCUTANEOUS
  Filled 2011-04-05 (×4): qty 0.4

## 2011-04-05 NOTE — ED Notes (Signed)
Assumption of care from Dr. Manus Gunning. Patient with +UTI, weakness, diffuse colitis. He lives with his wife, feels like he cannot go home. Levaquin, flagyl ordered. c diff cx ordered. Requesting pain medication. D/w [facility] hospitalist (unassigned)  BP 107/58  Pulse 86  Temp(Src) 97.7 F (36.5 C) (Oral)  Resp 17  SpO2 92%  Vitals noted as above with SPO2 92%. Pt not c/o si/sx pneumonia but abnl CXR. Levaquin ordered as above.  Forbes Cellar, MD 04/05/11 [phone]

## 2011-04-05 NOTE — ED Notes (Signed)
Back from CT

## 2011-04-05 NOTE — ED Provider Notes (Signed)
I saw and evaluated the patient, reviewed the resident's note and I agree with the findings and plan.  Loose stools today >10 episodes with 3 episodes of NBNB emesis.  No abdominal pain or fever.  Abdomen distended, soft, nontender.  Passing flatus.  Hx SBO.  Will image.   Glynn Octave, MD 04/05/11 [phone]
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
## 2011-04-06 LAB — BASIC METABOLIC PANEL
CO2: 25 mEq/L (ref 19–32)
Calcium: 8.1 mg/dL — ABNORMAL LOW (ref 8.4–10.5)
Glucose, Bld: 110 mg/dL — ABNORMAL HIGH (ref 70–99)
Potassium: 4 mEq/L (ref 3.5–5.1)
Sodium: 142 mEq/L (ref 135–145)

## 2011-04-06 LAB — CBC
Hemoglobin: 11.4 g/dL — ABNORMAL LOW (ref 13.0–17.0)
MCH: 27.5 pg (ref 26.0–34.0)
MCV: 85 fL (ref 78.0–100.0)
RBC: 4.14 MIL/uL — ABNORMAL LOW (ref 4.22–5.81)
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
## 2011-04-07 LAB — BASIC METABOLIC PANEL
CO2: 25 mEq/L (ref 19–32)
GFR calc non Af Amer: 62 mL/min — ABNORMAL LOW (ref >90)
Glucose, Bld: 104 mg/dL — ABNORMAL HIGH (ref 70–99)
Potassium: 3.9 mEq/L (ref 3.5–5.1)
Sodium: 143 mEq/L (ref 135–145)

## 2011-04-07 LAB — URINE CULTURE: Colony Count: >=100000

## 2011-04-07 MED ORDER — SIMETHICONE 40 MG/0.6ML PO SUSP
40.0000 mg | Freq: Four times a day (QID) | ORAL | Status: DC | PRN
  Administered 2011-04-07 – 2011-04-08 (×2): 40 mg via ORAL
  Filled 2011-04-07 (×2): qty 0.6
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
## 2011-04-08 DIAGNOSIS — K529 Noninfective gastroenteritis and colitis, unspecified: Secondary | ICD-10-CM | POA: Diagnosis present

## 2011-04-08 DIAGNOSIS — N390 Urinary tract infection, site not specified: Secondary | ICD-10-CM | POA: Diagnosis present

## 2011-04-08 MED ORDER — CIPROFLOXACIN HCL 500 MG PO TABS: 500.0000 mg | ORAL_TABLET | Freq: Two times a day (BID) | ORAL | Status: AC

## 2011-04-08 MED ORDER — CEFUROXIME AXETIL 500 MG PO TABS: 500.0000 mg | ORAL_TABLET | Freq: Two times a day (BID) | ORAL | Status: AC

## 2011-04-08 NOTE — Plan of Care (Signed)
Problem: Phase I Progression Outcomes Goal: OOB as tolerated unless otherwise ordered Outcome: Not Progressing Pt unable to move lower extremities Goal: Voiding-avoid urinary catheter unless indicated Outcome: Not Progressing Pt uses condom catheter; dr. Arthor Captain ordered that pt can go home with catheter
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
## 2012-02-01 ENCOUNTER — Emergency Department (HOSPITAL_COMMUNITY)
Admission: EM | Admit: 2012-02-01 | Discharge: 2012-02-01 | Discharge status: Against Medical Advice | Payer: Medicare (Managed Care) | Attending: Emergency Medicine | Admitting: Emergency Medicine

## 2012-02-01 ENCOUNTER — Ambulatory Visit (HOSPITAL_COMMUNITY)
Admission: RE | Admit: 2012-02-01 | Discharge: 2012-02-01 | Disposition: A | Discharge status: Home / Self Care | Payer: Medicare (Managed Care) | Source: Ambulatory Visit | Attending: Family Medicine | Admitting: Family Medicine

## 2012-02-01 ENCOUNTER — Encounter (HOSPITAL_COMMUNITY): Payer: Self-pay | Admitting: Emergency Medicine

## 2012-02-01 ENCOUNTER — Other Ambulatory Visit (HOSPITAL_COMMUNITY): Payer: Self-pay | Admitting: Family Medicine

## 2012-02-01 DIAGNOSIS — I517 Cardiomegaly: Secondary | ICD-10-CM | POA: Insufficient documentation

## 2012-02-01 DIAGNOSIS — R52 Pain, unspecified: Secondary | ICD-10-CM

## 2012-02-01 DIAGNOSIS — M79609 Pain in unspecified limb: Secondary | ICD-10-CM | POA: Insufficient documentation

## 2012-02-01 NOTE — ED Provider Notes (Signed)
Pt not seen by me in the ED.  Canda Podgorski B. Bernette Mayers, MD 02/01/12 2158

## 2012-02-01 NOTE — ED Notes (Signed)
Xray reports that pt was supposed to be outpatient radiology; pt's orders need to be d/c'ed and needs to be taken out of computer so they could register him as outpatient, will f/u with registration and charge to verify that we dismiss him correctly
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
## 2012-03-18 ENCOUNTER — Emergency Department (HOSPITAL_COMMUNITY): Payer: Medicare (Managed Care)

## 2012-03-18 ENCOUNTER — Emergency Department (HOSPITAL_COMMUNITY)
Admission: EM | Admit: 2012-03-18 | Discharge: 2012-03-19 | Disposition: A | Discharge status: Home / Self Care | Payer: Medicare (Managed Care) | Attending: Emergency Medicine | Admitting: Emergency Medicine

## 2012-03-18 ENCOUNTER — Encounter (HOSPITAL_COMMUNITY): Payer: Self-pay | Admitting: *Deleted

## 2012-03-18 DIAGNOSIS — R51 Headache: Secondary | ICD-10-CM | POA: Insufficient documentation

## 2012-03-18 DIAGNOSIS — M129 Arthropathy, unspecified: Secondary | ICD-10-CM | POA: Insufficient documentation

## 2012-03-18 DIAGNOSIS — G8220 Paraplegia, unspecified: Secondary | ICD-10-CM | POA: Insufficient documentation

## 2012-03-18 DIAGNOSIS — Z79899 Other long term (current) drug therapy: Secondary | ICD-10-CM | POA: Insufficient documentation

## 2012-03-18 DIAGNOSIS — N390 Urinary tract infection, site not specified: Secondary | ICD-10-CM | POA: Insufficient documentation

## 2012-03-18 DIAGNOSIS — Z87891 Personal history of nicotine dependence: Secondary | ICD-10-CM | POA: Insufficient documentation

## 2012-03-18 DIAGNOSIS — Z8701 Personal history of pneumonia (recurrent): Secondary | ICD-10-CM | POA: Insufficient documentation

## 2012-03-18 DIAGNOSIS — I10 Essential (primary) hypertension: Secondary | ICD-10-CM | POA: Insufficient documentation

## 2012-03-18 LAB — CBC WITH DIFFERENTIAL/PLATELET
Eosinophils Absolute: 0.5 10*3/uL (ref 0.0–0.7)
Hemoglobin: 14.3 g/dL (ref 13.0–17.0)
Lymphocytes Relative: 7 % — ABNORMAL LOW (ref 12–46)
Lymphs Abs: 0.7 10*3/uL (ref 0.7–4.0)
MCH: 27.8 pg (ref 26.0–34.0)
MCV: 85.4 fL (ref 78.0–100.0)
Monocytes Relative: 7 % (ref 3–12)
Neutrophils Relative %: 80 % — ABNORMAL HIGH (ref 43–77)
RBC: 5.14 MIL/uL (ref 4.22–5.81)

## 2012-03-18 LAB — URINE MICROSCOPIC-ADD ON

## 2012-03-18 LAB — URINALYSIS, ROUTINE W REFLEX MICROSCOPIC
Bilirubin Urine: NEGATIVE
Glucose, UA: NEGATIVE mg/dL
Specific Gravity, Urine: 1.023 (ref 1.005–1.030)
pH: 6.5 (ref 5.0–8.0)

## 2012-03-18 LAB — BASIC METABOLIC PANEL
BUN: 17 mg/dL (ref 6–23)
CO2: 29 mEq/L (ref 19–32)
GFR calc non Af Amer: 81 mL/min — ABNORMAL LOW (ref >90)
Glucose, Bld: 93 mg/dL (ref 70–99)
Potassium: 3.7 mEq/L (ref 3.5–5.1)

## 2012-03-18 MED ORDER — DEXTROSE 5 % IV SOLN
1.0000 g | Freq: Once | INTRAVENOUS | Status: AC
  Administered 2012-03-18: 1 g via INTRAVENOUS
  Filled 2012-03-18: qty 10

## 2012-03-18 MED ORDER — SODIUM CHLORIDE 0.9 % IV SOLN
INTRAVENOUS | Status: DC
  Administered 2012-03-18: 500 mL via INTRAVENOUS
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
## 2012-03-18 NOTE — ED Notes (Signed)
Pt waiting on [facility] to transport him home.
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
## 2012-03-19 LAB — URINE CULTURE: Colony Count: 85000

## 2012-09-12 ENCOUNTER — Other Ambulatory Visit: Payer: Self-pay | Admitting: Internal Medicine

## 2012-09-12 ENCOUNTER — Ambulatory Visit
Admission: RE | Admit: 2012-09-12 | Discharge: 2012-09-12 | Disposition: A | Discharge status: Home / Self Care | Payer: No Typology Code available for payment source | Source: Ambulatory Visit | Attending: Internal Medicine | Admitting: Internal Medicine

## 2012-09-12 DIAGNOSIS — R05 Cough: Secondary | ICD-10-CM

## 2013-09-25 ENCOUNTER — Encounter: Payer: Self-pay | Admitting: Cardiology

## 2013-09-30 ENCOUNTER — Other Ambulatory Visit: Payer: Self-pay | Admitting: *Deleted

## 2013-09-30 DIAGNOSIS — R0789 Other chest pain: Secondary | ICD-10-CM

## 2013-09-30 DIAGNOSIS — R9431 Abnormal electrocardiogram [ECG] [EKG]: Secondary | ICD-10-CM

## 2013-09-30 NOTE — Progress Notes (Signed)
Ordered for Marliss Coots, NP

## 2013-10-01 ENCOUNTER — Encounter (HOSPITAL_COMMUNITY): Payer: No Typology Code available for payment source

## 2013-10-06 ENCOUNTER — Other Ambulatory Visit: Payer: Self-pay

## 2013-10-06 ENCOUNTER — Encounter (HOSPITAL_COMMUNITY)
Admission: RE | Admit: 2013-10-06 | Discharge: 2013-10-06 | Disposition: A | Discharge status: Home / Self Care | Payer: Medicare (Managed Care) | Source: Ambulatory Visit | Attending: Cardiology | Admitting: Cardiology

## 2013-10-06 DIAGNOSIS — R9431 Abnormal electrocardiogram [ECG] [EKG]: Secondary | ICD-10-CM | POA: Diagnosis present

## 2013-10-06 DIAGNOSIS — R0789 Other chest pain: Secondary | ICD-10-CM | POA: Insufficient documentation

## 2013-10-06 MED ORDER — TECHNETIUM TC 99M TETROFOSMIN IV KIT
30.0000 | PACK | Freq: Once | INTRAVENOUS | Status: AC | PRN
  Administered 2013-10-06: 30 via INTRAVENOUS

## 2013-10-06 MED ORDER — REGADENOSON 0.4 MG/5ML IV SOLN
INTRAVENOUS | Status: AC
  Filled 2013-10-06: qty 5

## 2013-10-06 MED ORDER — TECHNETIUM TC 99M SESTAMIBI GENERIC - CARDIOLITE
10.0000 | Freq: Once | INTRAVENOUS | Status: AC | PRN
  Administered 2013-10-06: 10 via INTRAVENOUS

## 2013-10-06 MED ORDER — REGADENOSON 0.4 MG/5ML IV SOLN
0.4000 mg | Freq: Once | INTRAVENOUS | Status: AC
  Administered 2013-10-06: 0.4 mg via INTRAVENOUS

## 2013-10-06 NOTE — Progress Notes (Signed)
     The patient was seen in [facility] nuclear lab for a lexiscan myovue. He was not having any chest pain. He tolerated the procedure well. No acute ST or TW changes. He did have a RBBB at baseline. Results to Dr. Radford Pax.   Perry Mount PA-C  [facility]

## 2013-12-09 ENCOUNTER — Encounter: Payer: Self-pay | Admitting: Gastroenterology

## 2014-04-06 ENCOUNTER — Encounter (HOSPITAL_BASED_OUTPATIENT_CLINIC_OR_DEPARTMENT_OTHER): Payer: Medicare (Managed Care) | Attending: General Surgery

## 2014-04-06 DIAGNOSIS — L89313 Pressure ulcer of right buttock, stage 3: Secondary | ICD-10-CM | POA: Insufficient documentation
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
## 2014-04-13 ENCOUNTER — Encounter (HOSPITAL_BASED_OUTPATIENT_CLINIC_OR_DEPARTMENT_OTHER): Payer: Medicare (Managed Care) | Attending: General Surgery

## 2014-04-13 DIAGNOSIS — L89313 Pressure ulcer of right buttock, stage 3: Secondary | ICD-10-CM | POA: Insufficient documentation

## 2014-04-20 DIAGNOSIS — L89313 Pressure ulcer of right buttock, stage 3: Secondary | ICD-10-CM | POA: Diagnosis not present

## 2014-04-27 DIAGNOSIS — L89313 Pressure ulcer of right buttock, stage 3: Secondary | ICD-10-CM | POA: Diagnosis not present

## 2014-05-06 DIAGNOSIS — L89313 Pressure ulcer of right buttock, stage 3: Secondary | ICD-10-CM | POA: Diagnosis not present

## 2014-05-10 ENCOUNTER — Ambulatory Visit (HOSPITAL_COMMUNITY)
Admission: RE | Admit: 2014-05-10 | Discharge: 2014-05-10 | Disposition: A | Discharge status: Home / Self Care | Payer: Medicare (Managed Care) | Source: Ambulatory Visit | Attending: *Deleted | Admitting: *Deleted

## 2014-05-10 ENCOUNTER — Other Ambulatory Visit (HOSPITAL_COMMUNITY): Payer: Self-pay | Admitting: *Deleted

## 2014-05-10 ENCOUNTER — Other Ambulatory Visit: Payer: Self-pay | Admitting: *Deleted

## 2014-05-10 ENCOUNTER — Ambulatory Visit
Admission: RE | Admit: 2014-05-10 | Discharge: 2014-05-10 | Disposition: A | Discharge status: Home / Self Care | Payer: No Typology Code available for payment source | Source: Ambulatory Visit | Attending: *Deleted | Admitting: *Deleted

## 2014-05-10 DIAGNOSIS — M869 Osteomyelitis, unspecified: Secondary | ICD-10-CM

## 2014-05-10 DIAGNOSIS — N210 Calculus in bladder: Secondary | ICD-10-CM | POA: Diagnosis not present

## 2014-05-10 DIAGNOSIS — M25551 Pain in right hip: Secondary | ICD-10-CM | POA: Insufficient documentation

## 2014-05-10 DIAGNOSIS — R102 Pelvic and perineal pain: Secondary | ICD-10-CM | POA: Diagnosis not present

## 2014-05-11 ENCOUNTER — Encounter (HOSPITAL_BASED_OUTPATIENT_CLINIC_OR_DEPARTMENT_OTHER): Payer: Medicare (Managed Care)

## 2014-05-14 ENCOUNTER — Other Ambulatory Visit (HOSPITAL_COMMUNITY): Payer: Self-pay | Admitting: Student

## 2014-05-14 ENCOUNTER — Encounter (HOSPITAL_BASED_OUTPATIENT_CLINIC_OR_DEPARTMENT_OTHER): Payer: Medicare (Managed Care)

## 2014-05-14 ENCOUNTER — Encounter (HOSPITAL_BASED_OUTPATIENT_CLINIC_OR_DEPARTMENT_OTHER): Payer: Medicare (Managed Care) | Attending: Internal Medicine

## 2014-05-14 ENCOUNTER — Other Ambulatory Visit: Payer: Self-pay | Admitting: Student

## 2014-05-14 DIAGNOSIS — L89314 Pressure ulcer of right buttock, stage 4: Secondary | ICD-10-CM | POA: Insufficient documentation

## 2014-05-14 DIAGNOSIS — R52 Pain, unspecified: Secondary | ICD-10-CM

## 2014-05-17 ENCOUNTER — Inpatient Hospital Stay
Admission: RE | Admit: 2014-05-17 | Discharge: 2014-07-07 | Disposition: A | Discharge status: Home / Self Care | Payer: Medicare (Managed Care) | Source: Ambulatory Visit | Attending: Internal Medicine | Admitting: Internal Medicine

## 2014-05-17 ENCOUNTER — Non-Acute Institutional Stay (SKILLED_NURSING_FACILITY): Payer: Medicare (Managed Care) | Admitting: Internal Medicine

## 2014-05-17 DIAGNOSIS — E43 Unspecified severe protein-calorie malnutrition: Secondary | ICD-10-CM | POA: Diagnosis not present

## 2014-05-17 DIAGNOSIS — R1900 Intra-abdominal and pelvic swelling, mass and lump, unspecified site: Secondary | ICD-10-CM

## 2014-05-17 DIAGNOSIS — L8994 Pressure ulcer of unspecified site, stage 4: Secondary | ICD-10-CM

## 2014-05-17 DIAGNOSIS — K5641 Fecal impaction: Principal | ICD-10-CM

## 2014-05-17 DIAGNOSIS — L03317 Cellulitis of buttock: Secondary | ICD-10-CM | POA: Diagnosis not present

## 2014-05-18 ENCOUNTER — Encounter (HOSPITAL_COMMUNITY)
Admission: RE | Admit: 2014-05-18 | Discharge: 2014-05-18 | Disposition: A | Discharge status: Home / Self Care | Payer: Medicare (Managed Care) | Source: Skilled Nursing Facility | Attending: Internal Medicine | Admitting: Internal Medicine

## 2014-05-18 ENCOUNTER — Other Ambulatory Visit: Payer: No Typology Code available for payment source

## 2014-05-18 ENCOUNTER — Other Ambulatory Visit (HOSPITAL_COMMUNITY)
Admission: RE | Admit: 2014-05-18 | Discharge: 2014-05-18 | Disposition: A | Discharge status: Home / Self Care | Payer: Medicare (Managed Care) | Source: Ambulatory Visit | Attending: Internal Medicine | Admitting: Internal Medicine

## 2014-05-18 LAB — COMPREHENSIVE METABOLIC PANEL
ALT: 16 U/L (ref 0–53)
ANION GAP: 4 — AB (ref 5–15)
AST: 11 U/L (ref 0–37)
Albumin: 2.4 g/dL — ABNORMAL LOW (ref 3.5–5.2)
Alkaline Phosphatase: 77 U/L (ref 39–117)
BUN: 20 mg/dL (ref 6–23)
CO2: 32 mmol/L (ref 19–32)
CREATININE: 0.91 mg/dL (ref 0.50–1.35)
Calcium: 8.3 mg/dL — ABNORMAL LOW (ref 8.4–10.5)
Chloride: 107 mmol/L (ref 96–112)
GFR calc non Af Amer: 79 mL/min — ABNORMAL LOW (ref >90)
GLUCOSE: 100 mg/dL — AB (ref 70–99)
Potassium: 3.4 mmol/L — ABNORMAL LOW (ref 3.5–5.1)
Sodium: 143 mmol/L (ref 135–145)
TOTAL PROTEIN: 5.8 g/dL — AB (ref 6.0–8.3)
Total Bilirubin: 0.4 mg/dL (ref 0.3–1.2)

## 2014-05-18 LAB — CBC WITH DIFFERENTIAL/PLATELET
Basophils Absolute: 0 10*3/uL (ref 0.0–0.1)
Basophils Relative: 0 % (ref 0–1)
Eosinophils Absolute: 0.3 10*3/uL (ref 0.0–0.7)
Eosinophils Relative: 4 % (ref 0–5)
HCT: 34.3 % — ABNORMAL LOW (ref 39.0–52.0)
Hemoglobin: 10.7 g/dL — ABNORMAL LOW (ref 13.0–17.0)
LYMPHS ABS: 1 10*3/uL (ref 0.7–4.0)
LYMPHS PCT: 12 % (ref 12–46)
MCH: 26.6 pg (ref 26.0–34.0)
MCHC: 31.2 g/dL (ref 30.0–36.0)
MCV: 85.1 fL (ref 78.0–100.0)
Monocytes Absolute: 0.6 10*3/uL (ref 0.1–1.0)
Monocytes Relative: 8 % (ref 3–12)
NEUTROS ABS: 6 10*3/uL (ref 1.7–7.7)
NEUTROS PCT: 76 % (ref 43–77)
Platelets: 370 10*3/uL (ref 150–400)
RBC: 4.03 MIL/uL — AB (ref 4.22–5.81)
RDW: 14.1 % (ref 11.5–15.5)
WBC: 7.9 10*3/uL (ref 4.0–10.5)

## 2014-05-18 LAB — C-REACTIVE PROTEIN: CRP: 14 mg/dL — AB (ref <0.60)

## 2014-05-18 LAB — SEDIMENTATION RATE: Sed Rate: 109 mm/hr — ABNORMAL HIGH (ref 0–16)

## 2014-05-19 ENCOUNTER — Inpatient Hospital Stay (HOSPITAL_COMMUNITY): Payer: Medicare (Managed Care) | Attending: Internal Medicine

## 2014-05-19 ENCOUNTER — Non-Acute Institutional Stay (INDEPENDENT_AMBULATORY_CARE_PROVIDER_SITE_OTHER): Payer: Medicare (Managed Care) | Admitting: Internal Medicine

## 2014-05-19 ENCOUNTER — Other Ambulatory Visit: Payer: Self-pay | Admitting: *Deleted

## 2014-05-19 ENCOUNTER — Ambulatory Visit (HOSPITAL_COMMUNITY)
Admit: 2014-05-19 | Discharge: 2014-05-19 | Disposition: A | Discharge status: Home / Self Care | Payer: Medicare (Managed Care) | Source: Skilled Nursing Facility | Attending: Internal Medicine | Admitting: Internal Medicine

## 2014-05-19 DIAGNOSIS — E43 Unspecified severe protein-calorie malnutrition: Secondary | ICD-10-CM

## 2014-05-19 DIAGNOSIS — L8994 Pressure ulcer of unspecified site, stage 4: Secondary | ICD-10-CM | POA: Diagnosis not present

## 2014-05-19 DIAGNOSIS — L89314 Pressure ulcer of right buttock, stage 4: Secondary | ICD-10-CM | POA: Diagnosis not present

## 2014-05-19 DIAGNOSIS — L03317 Cellulitis of buttock: Secondary | ICD-10-CM | POA: Diagnosis present

## 2014-05-19 MED ORDER — SODIUM CHLORIDE 0.9 % IJ SOLN
10.0000 mL | INTRAMUSCULAR | Status: DC | PRN
Start: 2014-05-19 — End: 2014-05-20

## 2014-05-19 MED ORDER — ZOLPIDEM TARTRATE 5 MG PO TABS: 5.0000 mg | ORAL_TABLET | Freq: Every evening | ORAL | Status: DC | PRN

## 2014-05-19 MED ORDER — SODIUM CHLORIDE 0.9 % IJ SOLN: 10.0000 mL | Freq: Two times a day (BID) | INTRAMUSCULAR | Status: DC

## 2014-05-19 NOTE — Progress Notes (Signed)
Peripherally Inserted Central Catheter/Midline Placement  The IV Nurse has discussed with the patient and/or persons authorized to consent for the patient, the purpose of this procedure and the potential benefits and risks involved with this procedure.  The benefits include less needle sticks, lab draws from the catheter and patient may be discharged home with the catheter.  Risks include, but not limited to, infection, bleeding, blood clot (thrombus formation), and puncture of an artery; nerve damage and irregular heat beat.  Alternatives to this procedure were also discussed.  PICC/Midline Placement Documentation  PICC / Midline Single Lumen 00/37/04 PICC Right Basilic 43 cm 2 cm (Active)  Indication for Insertion or Continuance of Line Prolonged intravenous therapies 05/19/2014  1:00 PM  Exposed Catheter (cm) 2 cm 05/19/2014  1:00 PM  Site Assessment Clean;Dry;Intact 05/19/2014  1:00 PM  Line Status Flushed;Saline locked;Blood return noted 05/19/2014  1:00 PM  Dressing Type Transparent;Securing device 05/19/2014  1:00 PM  Dressing Status Clean;Dry;Intact 05/19/2014  1:00 PM  Line Care Connections checked and tightened 05/19/2014  1:00 PM  Dressing Intervention New dressing 05/19/2014  1:00 PM  Dressing Change Due 05/26/14 05/19/2014  1:00 PM       [name] 05/19/2014, 1:53 PM

## 2014-05-19 NOTE — Discharge Instructions (Signed)
PICC Insertion, Care After Refer to this sheet in the next few weeks. These instructions provide you with information on caring for yourself after your procedure. Your health care provider may also give you more specific instructions. Your treatment has been planned according to current medical practices, but problems sometimes occur. Call your health care provider if you have any problems or questions after your procedure. WHAT TO EXPECT AFTER THE PROCEDURE After your procedure, it is typical to have the following:  Mild discomfort at the insertion site. This should not last more than a day. HOME CARE INSTRUCTIONS  Rest at home for the remainder of the day after the procedure.  You may bend your arm and move it freely. If your PICC is near or at the bend of your elbow, avoid activity with repeated motion at the elbow.  Avoid lifting heavy objects as instructed by your health care provider.  Avoid using a crutch with the arm on the same side as your PICC. You may need to use a walker. Bandage Care  Keep your PICC bandage (dressing) clean and dry to prevent infection.  Ask your health care provider when you may shower. To keep the dressing dry, cover the PICC with plastic wrap and tape before showering. If the dressing does become wet, replace it right after the shower.  Do not soak in the bath, swim, or use hot tubs when you have a PICC.  Change the PICC dressing as instructed by your health care provider.  Change your PICC dressing if it becomes loose or wet. General PICC Care  Check the PICC insertion site daily for leakage, redness, swelling, or pain.  Flush the PICC as directed by your health care provider. Let your health care provider know right away if the PICC is difficult to flush or does not flush. Do not use force to flush the PICC.  Do not use a syringe that is less than 10 mL to flush the PICC.  Never pull or tug on the PICC.  Avoid blood pressure checks on the arm  with the PICC.  Keep your PICC identification card with you at all times.  Do not take the PICC out yourself. Only a trained health care professional should remove the PICC. SEEK MEDICAL CARE IF:  You have pain in your arm, ear, face, or teeth.  You have fever or chills.  You have drainage from the PICC insertion site.  You have redness or palpate a "cord" around the PICC insertion site.  You cannot flush the catheter. SEEK IMMEDIATE MEDICAL CARE IF:  You have swelling in the arm in which the PICC is inserted. Document Released: 01/14/2013 Document Revised: 03/31/2013 Document Reviewed: 01/14/2013 [facility] Patient Information 2015 [place], [place]. This information is not intended to replace advice given to you by your health care provider. Make sure you discuss any questions you have with your health care provider. Peripherally Inserted Central Catheter/Midline Placement  The IV Nurse has discussed with the patient and/or persons authorized to consent for the patient, the purpose of this procedure and the potential benefits and risks involved with this procedure.  The benefits include less needle sticks, lab draws from the catheter and patient may be discharged home with the catheter.  Risks include, but not limited to, infection, bleeding, blood clot (thrombus formation), and puncture of an artery; nerve damage and irregular heat beat.  Alternatives to this procedure were also discussed.  PICC/Midline Placement Documentation  PICC / Midline Single Lumen 05/19/14 PICC  Right Basilic 43 cm 2 cm (Active)  Indication for Insertion or Continuance of Line Prolonged intravenous therapies 05/19/2014  1:00 PM  Exposed Catheter (cm) 2 cm 05/19/2014  1:00 PM  Site Assessment Clean;Dry;Intact 05/19/2014  1:00 PM  Line Status Flushed;Saline locked;Blood return noted 05/19/2014  1:00 PM  Dressing Type Transparent;Securing device 05/19/2014  1:00 PM  Dressing Status Clean;Dry;Intact 05/19/2014  1:00 PM    Line Care Connections checked and tightened 05/19/2014  1:00 PM  Dressing Intervention New dressing 05/19/2014  1:00 PM  Dressing Change Due 05/26/14 05/19/2014  1:00 PM       [name] 05/19/2014, 1:55 PM

## 2014-05-19 NOTE — Telephone Encounter (Signed)
[facility] 
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
## 2014-05-22 NOTE — Progress Notes (Addendum)
Patient ID: [name], male   DOB: [birth year], 81 y.o.   MRN: [id number]                PROGRESS NOTE  DATE:  05/19/2014               FACILITY: [facility]        LEVEL OF CARE:   SNF   Acute Visit                        CHIEF COMPLAINT:  Follow up stage IV pressure wound over the right ischial tuberosity.     HISTORY OF PRESENT ILLNESS:  Please see my note of two days ago.  This is a man whom I have seen twice at the wound care center.  He was initially seen there for a two-month history of a pressure ulcer on his right ischial tuberosity at the end of December.  He was initially seen by Dr. Lindon Romp.    I picked this up about two weeks ago.  This is now a deep stage IV wound with palpable ischial tuberosity here.  There were copious amounts of necrotic material over this.   I did not think that this could be managed as an outpatient and, through the [facility] program, we were able to admit him here.    He needs an MRI, although I am strongly suspicious of underlying osteomyelitis.  A culture of the deep recess of this wound done in the wound care center showed Proteus that was resistant to quinolones and Septra, but sensitive to penicillins and cephalosporins.  I have had him on Augmentin and Cipro covering the Proteus as an outpatient.    PHYSICAL EXAMINATION:   SKIN:   INSPECTION:  Right buttock:  The area is a deep, probing wound, extremely malodorous.  We did a copious surgical debridement, removing as much necrotic subcutaneous tissue as I could do easily.  There was some discomfort, although the patient is reasonably insensate.    LABORATORY DATA:   His comprehensive metabolic panel is normal except for an albumin of 2.4.  I found this somewhat surprising.    His CBC and differential shows normal white count with differential.  His hemoglobin, however, is 10.7, normochromic and normocytic.  His hemoglobin was 14.3 on 03/18/2012.  Therefore, he seems to have had a recent  loss of hemoglobin.  He is going to need an anemia work-up.    His sedimentation rate is 109, C-reactive protein at 14.     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  The probability of osteomyelitis here is very high.  An MRI is booked.  He had a large surgical debridement today, as much as I could do.  This is going to need to be continued.  Unfortunately, we do not have access to pulse lavage.  I am going to put a PICC line in him and consider him for IV antibiotics, probably starting with Zosyn.    Normochromic, normocytic anemia.  Quite a bit worse than the last value I see on 03/18/2012.  He will probably need a work-up.  I suspect this is chronic inflammation/chronic disease.    Protein calorie malnutrition: I found this surprising as there was not such history. I have spoken in some detail to the patient  and later his wife about this issue. Malnutrition to this degree will certainly compromise wound healing      CPT CODE: 71696

## 2014-05-24 ENCOUNTER — Non-Acute Institutional Stay (SKILLED_NURSING_FACILITY): Payer: Medicare (Managed Care) | Admitting: Internal Medicine

## 2014-05-24 DIAGNOSIS — L8994 Pressure ulcer of unspecified site, stage 4: Secondary | ICD-10-CM | POA: Diagnosis not present

## 2014-05-24 DIAGNOSIS — L03317 Cellulitis of buttock: Secondary | ICD-10-CM | POA: Diagnosis not present

## 2014-05-24 DIAGNOSIS — E43 Unspecified severe protein-calorie malnutrition: Secondary | ICD-10-CM | POA: Diagnosis not present

## 2014-05-24 LAB — CBC WITH DIFFERENTIAL/PLATELET
BASOS PCT: 0 % (ref 0–1)
Basophils Absolute: 0 10*3/uL (ref 0.0–0.1)
EOS ABS: 0.6 10*3/uL (ref 0.0–0.7)
Eosinophils Relative: 9 % — ABNORMAL HIGH (ref 0–5)
HEMATOCRIT: 34.1 % — AB (ref 39.0–52.0)
Hemoglobin: 10.4 g/dL — ABNORMAL LOW (ref 13.0–17.0)
LYMPHS PCT: 20 % (ref 12–46)
Lymphs Abs: 1.3 10*3/uL (ref 0.7–4.0)
MCH: 26.3 pg (ref 26.0–34.0)
MCHC: 30.5 g/dL (ref 30.0–36.0)
MCV: 86.1 fL (ref 78.0–100.0)
MONOS PCT: 8 % (ref 3–12)
Monocytes Absolute: 0.6 10*3/uL (ref 0.1–1.0)
NEUTROS PCT: 63 % (ref 43–77)
Neutro Abs: 4.1 10*3/uL (ref 1.7–7.7)
PLATELETS: 360 10*3/uL (ref 150–400)
RBC: 3.96 MIL/uL — AB (ref 4.22–5.81)
RDW: 14.7 % (ref 11.5–15.5)
WBC: 6.6 10*3/uL (ref 4.0–10.5)

## 2014-05-24 LAB — BASIC METABOLIC PANEL
Anion gap: 6 (ref 5–15)
BUN: 15 mg/dL (ref 6–23)
CHLORIDE: 107 mmol/L (ref 96–112)
CO2: 32 mmol/L (ref 19–32)
Calcium: 8.4 mg/dL (ref 8.4–10.5)
Creatinine, Ser: 0.81 mg/dL (ref 0.50–1.35)
GFR calc Af Amer: >90 mL/min (ref >90)
GFR, EST NON AFRICAN AMERICAN: 83 mL/min — AB (ref >90)
GLUCOSE: 109 mg/dL — AB (ref 70–99)
Potassium: 3.8 mmol/L (ref 3.5–5.1)
Sodium: 145 mmol/L (ref 135–145)

## 2014-05-25 ENCOUNTER — Ambulatory Visit (HOSPITAL_COMMUNITY)
Admit: 2014-05-25 | Discharge: 2014-05-25 | Disposition: A | Discharge status: Home / Self Care | Payer: Medicare (Managed Care) | Attending: Family Medicine | Admitting: Family Medicine

## 2014-05-25 ENCOUNTER — Ambulatory Visit (HOSPITAL_COMMUNITY)
Admit: 2014-05-25 | Discharge: 2014-05-25 | Disposition: A | Discharge status: Home / Self Care | Payer: Medicare (Managed Care) | Source: Ambulatory Visit | Attending: Family Medicine | Admitting: Family Medicine

## 2014-05-25 DIAGNOSIS — R937 Abnormal findings on diagnostic imaging of other parts of musculoskeletal system: Secondary | ICD-10-CM | POA: Insufficient documentation

## 2014-05-25 DIAGNOSIS — R52 Pain, unspecified: Secondary | ICD-10-CM

## 2014-05-25 DIAGNOSIS — N433 Hydrocele, unspecified: Secondary | ICD-10-CM | POA: Insufficient documentation

## 2014-05-25 DIAGNOSIS — L89319 Pressure ulcer of right buttock, unspecified stage: Secondary | ICD-10-CM | POA: Diagnosis not present

## 2014-05-25 DIAGNOSIS — R102 Pelvic and perineal pain: Secondary | ICD-10-CM | POA: Diagnosis present

## 2014-05-25 DIAGNOSIS — N210 Calculus in bladder: Secondary | ICD-10-CM | POA: Diagnosis not present

## 2014-05-25 MED ORDER — GADOBENATE DIMEGLUMINE 529 MG/ML IV SOLN
20.0000 mL | Freq: Once | INTRAVENOUS | Status: AC | PRN
Start: 2014-05-25 — End: 2014-05-25
  Administered 2014-05-25: 20 mL via INTRAVENOUS

## 2014-05-25 NOTE — Progress Notes (Addendum)
Patient ID: [name], male   DOB: 1933/09/12, 81 y.o.   MRN: [id number]                PROGRESS NOTE  DATE:  05/24/2014                 FACILITY: [facility]                                 LEVEL OF CARE:   SNF   Acute Visit                                 CHIEF COMPLAINT:  Continued review of wound over his right ischial tuberosity.    HISTORY OF PRESENT ILLNESS:  Mr. 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  He has extensive soft tissue infection, at least.  A second culture I did since his arrival to the facility shows the same organism, Proteus mirabilis, which is quinolone-resistant.  I have him on Zosyn and I am planning to continue IV Zosyn for two weeks, assuming that there is not underlying osteomyelitis.  He still does not have an MRI of the ischium done.  His sed rate, however, is 109.    LABORATORY DATA:   Lab work from today shows an essentially normal basic metabolic panel.    His white count is 6.6 with 63% neutrophils.    As mentioned, his sedimentation rate is 109.  CRP 14  PHYSICAL EXAMINATION:   SKIN:   INSPECTION:  Right ischium:   The wound in question again underwent an aggressive surgical debridement done in the facility of copious amounts of adherent tan-colored eschar.  This debrides with some difficulty, although I think we are making progress here.  There is less odor, less evidence of inflammation, although there is still exposed bone that I think is going to be present at the end of this.    ASSESSMENT/PLAN:                     Cellulitis of a stage IV decubitus ulcer on the right buttock.  I am going to continue the Zosyn as outlined for two weeks, assuming he does not have osteomyelitis.  If he does have osteomyelitis, then antibiotics will need to continue for six weeks, likely intravenously.  This will ultimately require a wound VAC, although it is not ready for this yet.  More debridement will need  to be done and the status of the underlying ischium will need to be determined with an MRI.  Ultimately, this may require plastic surgery with flap closure, although we are very far away from that type of decision.    Again difficult surgical  Debridement of copious amounts of necrotic subcutaneous tissue. He tolerated this well  If he does not have osteomyelitis, it is not impossible to see a scenario where he can go home with oral antibiotics, a wound VAC, a hospital bed, pressure relief, etc.  If osteomyelitis is present he will require 6 weeks of IV antibiotics likely changing to a one a day alternative drug probably rocephin. Zosyn while he is here provides the additional benefit of anaerobic coverage which in this situation is always a worry.  I have talked to him today once again about his albumin of 2.4, indicative  of already moderate to severe protein calorie malnutrition. He assures me he is eating everything he is given    CPT CODE: 26834

## 2014-05-26 ENCOUNTER — Non-Acute Institutional Stay (SKILLED_NURSING_FACILITY): Payer: Medicare (Managed Care) | Admitting: Internal Medicine

## 2014-05-26 DIAGNOSIS — L03317 Cellulitis of buttock: Secondary | ICD-10-CM | POA: Diagnosis not present

## 2014-05-26 DIAGNOSIS — L8994 Pressure ulcer of unspecified site, stage 4: Secondary | ICD-10-CM | POA: Diagnosis not present

## 2014-05-27 ENCOUNTER — Ambulatory Visit (HOSPITAL_COMMUNITY): Payer: No Typology Code available for payment source

## 2014-05-31 ENCOUNTER — Non-Acute Institutional Stay (SKILLED_NURSING_FACILITY): Payer: Medicare (Managed Care) | Admitting: Internal Medicine

## 2014-05-31 ENCOUNTER — Encounter (HOSPITAL_COMMUNITY)
Admission: RE | Admit: 2014-05-31 | Discharge: 2014-05-31 | Disposition: A | Discharge status: Home / Self Care | Payer: Medicare (Managed Care) | Source: Skilled Nursing Facility | Attending: Internal Medicine | Admitting: Internal Medicine

## 2014-05-31 ENCOUNTER — Encounter (HOSPITAL_COMMUNITY)
Admission: RE | Admit: 2014-05-31 | Discharge: 2014-05-31 | Disposition: A | Discharge status: Home / Self Care | Payer: Medicare (Managed Care) | Source: Ambulatory Visit | Attending: Internal Medicine | Admitting: Internal Medicine

## 2014-05-31 DIAGNOSIS — L8994 Pressure ulcer of unspecified site, stage 4: Secondary | ICD-10-CM | POA: Diagnosis not present

## 2014-05-31 DIAGNOSIS — L03317 Cellulitis of buttock: Secondary | ICD-10-CM

## 2014-05-31 LAB — CBC WITH DIFFERENTIAL/PLATELET
BASOS PCT: 1 % (ref 0–1)
Basophils Absolute: 0 10*3/uL (ref 0.0–0.1)
EOS ABS: 1.7 10*3/uL — AB (ref 0.0–0.7)
Eosinophils Relative: 24 % — ABNORMAL HIGH (ref 0–5)
HEMATOCRIT: 34.8 % — AB (ref 39.0–52.0)
Hemoglobin: 10.6 g/dL — ABNORMAL LOW (ref 13.0–17.0)
LYMPHS ABS: 1.3 10*3/uL (ref 0.7–4.0)
Lymphocytes Relative: 18 % (ref 12–46)
MCH: 26.2 pg (ref 26.0–34.0)
MCHC: 30.5 g/dL (ref 30.0–36.0)
MCV: 86.1 fL (ref 78.0–100.0)
MONOS PCT: 8 % (ref 3–12)
Monocytes Absolute: 0.6 10*3/uL (ref 0.1–1.0)
Neutro Abs: 3.5 10*3/uL (ref 1.7–7.7)
Neutrophils Relative %: 49 % (ref 43–77)
PLATELETS: 312 10*3/uL (ref 150–400)
RBC: 4.04 MIL/uL — ABNORMAL LOW (ref 4.22–5.81)
RDW: 16 % — AB (ref 11.5–15.5)
WBC: 7.1 10*3/uL (ref 4.0–10.5)

## 2014-05-31 LAB — SEDIMENTATION RATE: SED RATE: 40 mm/h — AB (ref 0–16)

## 2014-05-31 LAB — C-REACTIVE PROTEIN: CRP: 2.2 mg/dL — AB (ref <0.60)

## 2014-05-31 NOTE — Progress Notes (Signed)
Patient ID: [name], male   DOB: 02/28/1934, 81 y.o.   MRN: [id number]               PROGRESS NOTE  DATE:  05/26/2014                   FACILITY: [facility]                          LEVEL OF CARE:   SNF   Acute Visit   CHIEF COMPLAINT:  Follow up wound over the right ischial tuberosity.    HISTORY OF PRESENT ILLNESS:  [name] is being followed here for what was initially an unstageable wound over the right ischial tuberosity.  He had been followed in the wound care center for roughly two months by the time I saw him.  It became clear that he would require much more extensive work on this wound.  Therefore, he has been admitted to this facility with the cooperation of the [facility] program.  Cultures of this area have shown Proteus twice.  He has needed serial mechanical debridements, which I have done.    His MRI report done yesterday shows a deep decubitus ulcer over the right ischium with underlying marrow signal abnormality and enhancement within ischium, suspicious for osteomyelitis.  He also has lateral extension of the soft tissue infection posterior to the right femur and into the ischial femoral space without a well-defined abscess.  He also has an incidental note of a chronic large bladder calculus and rectosigmoid distention with scrotal hydroceles.    PHYSICAL EXAMINATION:   SKIN:   INSPECTION:  Right ischial tuberosity wound continues to look better.  With serial debridements, there is now viable tissue here.  The wound abuts on the right ischium.  Therefore, I have no doubt that this is a stage IV wound.  The odor and drainage are also improved.     ASSESSMENT/PLAN:                     Stage IV decubitus ulcer over the right ischium with co-existent Proteus, osteomyelitis, and extensive soft tissue infection.  Underlying osteomyelitis now documented with lateral extension of soft tissue infection.  I plan to run Zosyn for a minimum of two weeks, which should cover  the Proteus as well as anything anaerobic that might co-exist here.  Ultimately, when he is transitioned to home, I think he can go home on once-daily ceftriaxone.  Serial debridements seem to be improving the overall condition of this wound.  I am thinking that he might be able to begin a wound VAC in 7-10 days.    I would like to recheck his sedimentation rate and C-reactive protein next week, which were previously 109 and 14, respectively.  If these are satisfactorily reduced next week, I will feel reasonable about planning for a discharge either late next week or early the week after.     CPT CODE: 59977

## 2014-06-01 ENCOUNTER — Encounter (HOSPITAL_COMMUNITY)
Admission: RE | Admit: 2014-06-01 | Discharge: 2014-06-01 | Disposition: A | Discharge status: Home / Self Care | Payer: Medicare (Managed Care) | Source: Skilled Nursing Facility | Attending: Internal Medicine | Admitting: Internal Medicine

## 2014-06-01 LAB — CBC WITH DIFFERENTIAL/PLATELET
Basophils Absolute: 0 10*3/uL (ref 0.0–0.1)
Basophils Relative: 0 % (ref 0–1)
EOS PCT: 21 % — AB (ref 0–5)
Eosinophils Absolute: 1.1 10*3/uL — ABNORMAL HIGH (ref 0.0–0.7)
HEMATOCRIT: 36.2 % — AB (ref 39.0–52.0)
Hemoglobin: 11.3 g/dL — ABNORMAL LOW (ref 13.0–17.0)
Lymphocytes Relative: 22 % (ref 12–46)
Lymphs Abs: 1.2 10*3/uL (ref 0.7–4.0)
MCH: 26.8 pg (ref 26.0–34.0)
MCHC: 31.2 g/dL (ref 30.0–36.0)
MCV: 85.8 fL (ref 78.0–100.0)
MONO ABS: 0.3 10*3/uL (ref 0.1–1.0)
Monocytes Relative: 7 % (ref 3–12)
Neutro Abs: 2.7 10*3/uL (ref 1.7–7.7)
Neutrophils Relative %: 50 % (ref 43–77)
Platelets: 307 10*3/uL (ref 150–400)
RBC: 4.22 MIL/uL (ref 4.22–5.81)
RDW: 16.1 % — ABNORMAL HIGH (ref 11.5–15.5)
WBC: 5.3 10*3/uL (ref 4.0–10.5)

## 2014-06-01 LAB — SEDIMENTATION RATE: SED RATE: 24 mm/h — AB (ref 0–16)

## 2014-06-01 LAB — C-REACTIVE PROTEIN: CRP: 2 mg/dL — ABNORMAL HIGH (ref <0.60)

## 2014-06-01 NOTE — Progress Notes (Addendum)
Patient ID: [name], male   DOB: [birth year], 81 y.o.   MRN: [id number]                PROGRESS NOTE  DATE:  05/31/2014               FACILITY: [facility]       LEVEL OF CARE:   SNF   Acute Visit                                    CHIEF COMPLAINT:  Continued follow-up of right ischial tuberosity stage IV wound, complicated by Proteus infection and underlying osteomyelitis.    HISTORY OF PRESENT ILLNESS:  I have been monitoring this man each visit to the center.  I have previously had to do extensive surgical debridements on this.   He has  been receiving Zosyn for the Proteus and also other anaerobic bacteria.    He has been systemically well.  He has been improving his oral intake to address a serum albumin of 2.4.  He has also been careful about pressure relief.    PHYSICAL EXAMINATION:   SKIN:   INSPECTION:  Wound over the right ischial tuberosity:  There has been considerable improvement here.  The tissue generally looks a lot healthier.  Visible muscle tissue looks healthy.  There is still some eschar right against the ischial tuberosity itself, although no further debridement was required today.    LABORATORY DATA:  Lab work from today shows:    White count 7.1, hemoglobin 10.6, platelets 312.  Normal differential to the white count.    His sedimentation rate is down to 40 from 109.    C-reactive protein, which was 14 on 05/18/2014, is pending at the time of this dictation.    ASSESSMENT/PLAN:                              Stage IV pressure ulcer with underlying osteomyelitis.  This is considerably improved.  The improvement in the sedimentation rate makes me comfortable placing a wound VAC over this area.  It is quite possible that he will be ready to transition into the home by the end of this week.  I will change him to Rocephin, which can be given IV or IM, once a day.  He will need to continue the wound VAC.  He will need home health and follow-up in the  wound care center.  Perhaps 2 more weeks of rocephin and then Augmentin for the final 2 weeks??  He is developing diarrhea.  I will do a stool for C.diff on him.  Hold any laxatives.

## 2014-06-02 ENCOUNTER — Non-Acute Institutional Stay (SKILLED_NURSING_FACILITY): Payer: Medicare (Managed Care) | Admitting: Internal Medicine

## 2014-06-02 DIAGNOSIS — L8994 Pressure ulcer of unspecified site, stage 4: Secondary | ICD-10-CM

## 2014-06-02 DIAGNOSIS — E43 Unspecified severe protein-calorie malnutrition: Secondary | ICD-10-CM

## 2014-06-02 DIAGNOSIS — M24551 Contracture, right hip: Secondary | ICD-10-CM | POA: Diagnosis not present

## 2014-06-02 DIAGNOSIS — L03317 Cellulitis of buttock: Secondary | ICD-10-CM

## 2014-06-02 NOTE — Progress Notes (Signed)
Patient ID: [name], male   DOB: [birth year], 81 y.o.   MRN: [id number] [facility] Chief complaint; follow-up stage IV right ischial tuberosity wound with underlying osteomyelitis/rigidity of the right leg/abdominal distention History; this is a patient I been carefully following for a stage IV pressure ulcer over his right ishium with culture showing Proteus an MRI documenting underlying osteomyelitis. He has been on Zosyn and really doing very well. His inflammatory markers including a sedimentation rate and C reactive protein are both the markedly reduced. The wound is cleaning up nicely. We put a wound VAC on this 2 days ago and things have been going quite well.  Other issues have come up; #1 increasing right leg rigidity #2 abdominal distention with diarrhea. I had ordered a stool for C. difficile without really seeing the issue 2 days ago. #3 discharge issues  Review of systems Gen.; the patient is not complaining of any systemic issues particularly no fever. Abdomen no abdominal pain nausea or vomiting. Extremities; states that his right leg even before he arrived here with tends to abduct  and flex at the hip and flex at the knee this is apparently been worsening  Physical examination Gen. the patient is not systemically unwell Cardiac heart sounds are normal he appears to be euvolemic Abdomen; very distended, bowel sounds are present but intermittent. There is no overt mass. No liver no spleen Rectal only expressed a large amount of gas there was no stool present GU; Foley still in place Extremities; when I arrived in the room his leg was abducted and flexed at the hip and flexed at the knee. This had his right leg at 90 angle under his left. I was able to overcome this was some difficulty although there is still some remaining flexion contractures at the hip and knee. This is going to need to be aggressively addressed or we will soon have an insurmountable degree of flexion  contracture here. Wound exam; this looks considerably better with healthy granulation no evidence of infection. There is still some serosanguineous drainage in the back container although this wound has remained a remarkable turnaround.  Impression/plan #1 stage IV pressure ulcer over the right ishium. This is considerably improved and I am really happy with condition of it. He is probably still 4-8 weeks away from healing. #2 culture showed Proteus; I am well pleased with the progress on Zosyn however once he goes home I think this could be transitioned to IV Rocephin for another week to 2 and then a two-week course of Augmentin to complete the 6 weeks of antibiotics #3 inflammatory markers considerably improved consistent with overall improvement in his infection status #4 worsening flexion contractures at the right hip and knee; I was not pleased about this today. I'll have physical therapy look at this this may require some form of brace, I am really not sure about this currently #5 protein calorie malnutrition; we are addressing this the patient tells me he eats everything that he is given #6 severe abdominal distention which I think is probably fecal impaction and/or abdominal ileus. We will do an x-ray of his abdomen. I don't believe this is a diarrheal issue either C. difficile or anything else. Although certainly C. difficile can cause and I ileus people are usually much sicker than he appears    Results for [name], [name] (MRN [id number]) as of 06/02/2014 10:54  Ref. Range 05/24/2014 06:07 05/25/2014 12:12 05/31/2014 06:00 06/01/2014 07:51 06/01/2014 08:00  Sodium Latest Range: 135-145 mmol/L  145      Potassium Latest Range: 3.5-5.1 mmol/L 3.8      Chloride Latest Range: 96-112 mmol/L 107      CO2 Latest Range: 19-32 mmol/L 32      BUN Latest Range: 6-23 mg/dL 15      Creatinine Latest Range: 0.50-1.35 mg/dL 0.81      Calcium Latest Range: 8.4-10.5 mg/dL 8.4      GFR calc non Af Amer Latest  Range: >90 mL/min 83 (L)      GFR calc Af Amer Latest Range: >90 mL/min >90      Glucose Latest Range: 70-99 mg/dL 109 (H)      Anion gap Latest Range: 5-15  6      CRP Latest Range: <0.60 mg/dL   2.2 (H) 2.0 (H)   WBC Latest Range: 4.0-10.5 K/uL 6.6  7.1  5.3  RBC Latest Range: 4.22-5.81 MIL/uL 3.96 (L)  4.04 (L)  4.22  Hemoglobin Latest Range: 13.0-17.0 g/dL 10.4 (L)  10.6 (L)  11.3 (L)  HCT Latest Range: 39.0-52.0 % 34.1 (L)  34.8 (L)  36.2 (L)  MCV Latest Range: 78.0-100.0 fL 86.1  86.1  85.8  MCH Latest Range: 26.0-34.0 pg 26.3  26.2  26.8  MCHC Latest Range: 30.0-36.0 g/dL 30.5  30.5  31.2  RDW Latest Range: 11.5-15.5 % 14.7  16.0 (H)  16.1 (H)  Platelets Latest Range: 150-400 K/uL 360  312  307  Neutrophils Relative % Latest Range: 43-77 % 63  49  50  Lymphocytes Relative Latest Range: 12-46 % 20  18  22   Monocytes Relative Latest Range: 3-12 % 8  8  7   Eosinophils Relative Latest Range: 0-5 % 9 (H)  24 (H)  21 (H)  Basophils Relative Latest Range: 0-1 % 0  1  0  NEUT# Latest Range: 1.7-7.7 K/uL 4.1  3.5  2.7  Lymphocytes Absolute Latest Range: 0.7-4.0 K/uL 1.3  1.3  1.2  Monocytes Absolute Latest Range: 0.1-1.0 K/uL 0.6  0.6  0.3  Eosinophils Absolute Latest Range: 0.0-0.7 K/uL 0.6  1.7 (H)  1.1 (H)  Basophils Absolute Latest Range: 0.0-0.1 K/uL 0.0  0.0  0.0  Sed Rate Latest Range: 0-16 mm/hr   40 (H) 24 (H)   MR PELVIS W WO CONTRAST No range found  Rpt

## 2014-06-03 ENCOUNTER — Ambulatory Visit (HOSPITAL_COMMUNITY): Payer: Medicare (Managed Care) | Attending: Internal Medicine

## 2014-06-03 DIAGNOSIS — R1084 Generalized abdominal pain: Secondary | ICD-10-CM | POA: Insufficient documentation

## 2014-06-03 DIAGNOSIS — N200 Calculus of kidney: Secondary | ICD-10-CM | POA: Insufficient documentation

## 2014-06-03 DIAGNOSIS — R933 Abnormal findings on diagnostic imaging of other parts of digestive tract: Secondary | ICD-10-CM | POA: Diagnosis not present

## 2014-06-03 DIAGNOSIS — K5641 Fecal impaction: Secondary | ICD-10-CM | POA: Diagnosis not present

## 2014-06-04 ENCOUNTER — Encounter (HOSPITAL_COMMUNITY)
Admission: RE | Admit: 2014-06-04 | Discharge: 2014-06-04 | Disposition: A | Discharge status: Home / Self Care | Payer: Medicare (Managed Care) | Source: Ambulatory Visit | Attending: Internal Medicine | Admitting: Internal Medicine

## 2014-06-04 LAB — BASIC METABOLIC PANEL
ANION GAP: 4 — AB (ref 5–15)
BUN: 20 mg/dL (ref 6–23)
CALCIUM: 8.5 mg/dL (ref 8.4–10.5)
CHLORIDE: 110 mmol/L (ref 96–112)
CO2: 29 mmol/L (ref 19–32)
CREATININE: 0.89 mg/dL (ref 0.50–1.35)
GFR calc Af Amer: >90 mL/min (ref >90)
GFR calc non Af Amer: 80 mL/min — ABNORMAL LOW (ref >90)
Glucose, Bld: 102 mg/dL — ABNORMAL HIGH (ref 70–99)
Potassium: 3.6 mmol/L (ref 3.5–5.1)
SODIUM: 143 mmol/L (ref 135–145)

## 2014-06-04 LAB — MAGNESIUM: Magnesium: 2.1 mg/dL (ref 1.5–2.5)

## 2014-06-07 ENCOUNTER — Non-Acute Institutional Stay (SKILLED_NURSING_FACILITY): Payer: Medicare (Managed Care) | Admitting: Internal Medicine

## 2014-06-07 ENCOUNTER — Encounter (HOSPITAL_COMMUNITY)
Admission: RE | Admit: 2014-06-07 | Discharge: 2014-06-07 | Disposition: A | Discharge status: Home / Self Care | Payer: Medicare (Managed Care) | Source: Skilled Nursing Facility | Attending: Internal Medicine | Admitting: Internal Medicine

## 2014-06-07 DIAGNOSIS — M24551 Contracture, right hip: Secondary | ICD-10-CM | POA: Diagnosis not present

## 2014-06-07 DIAGNOSIS — L8994 Pressure ulcer of unspecified site, stage 4: Secondary | ICD-10-CM | POA: Diagnosis not present

## 2014-06-07 DIAGNOSIS — L03317 Cellulitis of buttock: Secondary | ICD-10-CM

## 2014-06-07 LAB — CBC WITH DIFFERENTIAL/PLATELET
BASOS ABS: 0 10*3/uL (ref 0.0–0.1)
Basophils Relative: 0 % (ref 0–1)
EOS PCT: 26 % — AB (ref 0–5)
Eosinophils Absolute: 1.4 10*3/uL — ABNORMAL HIGH (ref 0.0–0.7)
HCT: 35.4 % — ABNORMAL LOW (ref 39.0–52.0)
Hemoglobin: 11 g/dL — ABNORMAL LOW (ref 13.0–17.0)
LYMPHS PCT: 25 % (ref 12–46)
Lymphs Abs: 1.3 10*3/uL (ref 0.7–4.0)
MCH: 26.8 pg (ref 26.0–34.0)
MCHC: 31.1 g/dL (ref 30.0–36.0)
MCV: 86.1 fL (ref 78.0–100.0)
Monocytes Absolute: 0.5 10*3/uL (ref 0.1–1.0)
Monocytes Relative: 9 % (ref 3–12)
Neutro Abs: 2 10*3/uL (ref 1.7–7.7)
Neutrophils Relative %: 39 % — ABNORMAL LOW (ref 43–77)
PLATELETS: 257 10*3/uL (ref 150–400)
RBC: 4.11 MIL/uL — ABNORMAL LOW (ref 4.22–5.81)
RDW: 16.9 % — AB (ref 11.5–15.5)
WBC: 5.2 10*3/uL (ref 4.0–10.5)

## 2014-06-07 LAB — URINALYSIS, ROUTINE W REFLEX MICROSCOPIC
BILIRUBIN URINE: NEGATIVE
Glucose, UA: NEGATIVE mg/dL
Ketones, ur: NEGATIVE mg/dL
NITRITE: NEGATIVE
PROTEIN: 100 mg/dL — AB
Specific Gravity, Urine: >1.03 — ABNORMAL HIGH (ref 1.005–1.030)
Urobilinogen, UA: 1 mg/dL (ref 0.0–1.0)
pH: 6 (ref 5.0–8.0)

## 2014-06-07 LAB — COMPREHENSIVE METABOLIC PANEL
ALBUMIN: 2.8 g/dL — AB (ref 3.5–5.2)
ALK PHOS: 72 U/L (ref 39–117)
ALT: 16 U/L (ref 0–53)
ANION GAP: 3 — AB (ref 5–15)
AST: 17 U/L (ref 0–37)
BUN: 16 mg/dL (ref 6–23)
CO2: 31 mmol/L (ref 19–32)
CREATININE: 0.91 mg/dL (ref 0.50–1.35)
Calcium: 8.6 mg/dL (ref 8.4–10.5)
Chloride: 110 mmol/L (ref 96–112)
GFR calc non Af Amer: 79 mL/min — ABNORMAL LOW (ref >90)
Glucose, Bld: 86 mg/dL (ref 70–99)
Potassium: 3.3 mmol/L — ABNORMAL LOW (ref 3.5–5.1)
Sodium: 144 mmol/L (ref 135–145)
TOTAL PROTEIN: 5.5 g/dL — AB (ref 6.0–8.3)
Total Bilirubin: 0.3 mg/dL (ref 0.3–1.2)

## 2014-06-07 LAB — URINE MICROSCOPIC-ADD ON

## 2014-06-08 NOTE — Progress Notes (Signed)
Patient ID: [name], male   DOB: Jul 28, 1933, 81 y.o.   MRN: [id number]               PROGRESS NOTE  DATE:  06/07/2014            FACILITY: [facility]        LEVEL OF CARE:   SNF   Acute Visit   CHIEF COMPLAINT:  Follow up medical issues including ileus, infected right ischial tuberosity.         HISTORY OF PRESENT ILLNESS:  This is a patient with a right ischial tuberosity wound complicated by Proteus infection with underlying osteomyelitis, documented on an MRI.  He has been on Rocephin since his arrival in the facility.  He underwent an extensive series of surgical debridements and was started on a wound VAC last week.  The wound has been making substantial progress.  His C-reactive protein came down to 2 and sedimentation rate down to 24, from values over 14, I believe, for the C-reactive protein and over 100 for the sedimentation rate.    Unfortunately, there have been ancillary issues including last week when he developed severe abdominal distention and "diarrhea".  Abdominal view revealed urinary bladder calculi.  There was generalized bowel dilation suggesting a colonic ileus.  There was moderate stool in the colon without frank obstruction.  There is also noted an aortic stent.    Finally, he is developing contractures of the right leg.  This is flexed and adducted at the hip and flexed at the knee continuously.  I have been reluctant to use antispasmodics here as they are all highly anticholinergic drugs which might contribute to a refractory ileus.    LABORATORY DATA:   Lab work from today shows a sodium of 144, potassium of 3.3, BUN 16, creatinine 1.91.    His albumin is 2.8, although that is an improvement from previously at 2.4.    CBC is reasonably normal.  He has slightly reduced neutrophils at 39%, elevated eosinophils at 26.      REVIEW OF SYSTEMS:          CHEST/RESPIRATORY:  The patient is not complaining of shortness of breath.   CARDIAC:   No chest  pain.   GI:   Still some abdominal distention.   GU:  Foley catheter in place.  This was to protect his very infected wound when he came in.    PHYSICAL EXAMINATION:           GENERAL APPEARANCE:  The patient is not in any distress.     CARDIOVASCULAR:  CARDIAC:   Heart sounds are normal.  He appears to be euvolemic.   GASTROINTESTINAL:  ABDOMEN:   Still quite distended with high-pitched "tiny" bowel sounds.  I think this is better, but certainly not resolved.   CIRCULATION:   EDEMA/VARICOSITIES:  Extremities:  He has no edema.  No evidence of a DVT.   MUSCULOSKELETAL:   EXTREMITIES:  He has spastic hemiplegia.  I am worrying about the contracture developing in the right leg.   WOUND EXAM:   This looks really very good, much better than I could have hoped for.  There has been a dramatic improvement here.  Granulation is healthy.  Granulation tissue is moving upwards.    ASSESSMENT/PLAN:                     Stage IV ischial tuberosity wound, complicated by Proteus infection and underlying osteomyelitis.  I think we can change him to once-a-day Rocephin, discontinue the Zosyn, and discontinue the PICC line.  I have discussed this with the patient.    Neutropenia and eosinophilia.  I think this is secondary to Zosyn.  I am not really that concerned about this and this is not the major reason I am stopping the Zosyn.  However, I think this is the issue here.    Ileus.  This is complicated by a low potassium.  I will replace this and check his magnesium level, as well.    The potassium needs to be pushed up to over 4.    Flexion contracture of the right leg.  Once again, I think this is a positioning issue, maybe a brace issue.  However, this is going to need to be addressed.  Antispasmodic drugs are highly anticholinergic agents and I am concerned, vis-a-vis the ileus.     What looks like blood in his urine.  He has a Foley catheter and, at least by his abdominal x-rays, bladder calculi.  I  will check a culture, however.

## 2014-06-09 ENCOUNTER — Encounter (HOSPITAL_COMMUNITY)
Admission: RE | Admit: 2014-06-09 | Discharge: 2014-06-09 | Disposition: A | Discharge status: Home / Self Care | Payer: Medicare (Managed Care) | Source: Skilled Nursing Facility | Attending: Internal Medicine | Admitting: Internal Medicine

## 2014-06-09 LAB — COMPREHENSIVE METABOLIC PANEL
ALT: 19 U/L (ref 0–53)
ANION GAP: 7 (ref 5–15)
AST: 20 U/L (ref 0–37)
Albumin: 2.9 g/dL — ABNORMAL LOW (ref 3.5–5.2)
Alkaline Phosphatase: 74 U/L (ref 39–117)
BILIRUBIN TOTAL: 0.3 mg/dL (ref 0.3–1.2)
BUN: 14 mg/dL (ref 6–23)
CHLORIDE: 107 mmol/L (ref 96–112)
CO2: 29 mmol/L (ref 19–32)
CREATININE: 0.84 mg/dL (ref 0.50–1.35)
Calcium: 8.5 mg/dL (ref 8.4–10.5)
GFR, EST NON AFRICAN AMERICAN: 82 mL/min — AB (ref >90)
Glucose, Bld: 94 mg/dL (ref 70–99)
Potassium: 3.6 mmol/L (ref 3.5–5.1)
Sodium: 143 mmol/L (ref 135–145)
Total Protein: 5.6 g/dL — ABNORMAL LOW (ref 6.0–8.3)

## 2014-06-09 LAB — URINE CULTURE
Colony Count: NO GROWTH
Culture: NO GROWTH

## 2014-06-09 LAB — MAGNESIUM: MAGNESIUM: 2.2 mg/dL (ref 1.5–2.5)

## 2014-06-14 ENCOUNTER — Non-Acute Institutional Stay (SKILLED_NURSING_FACILITY): Payer: Medicare (Managed Care) | Admitting: Internal Medicine

## 2014-06-14 DIAGNOSIS — L03317 Cellulitis of buttock: Secondary | ICD-10-CM | POA: Diagnosis not present

## 2014-06-14 DIAGNOSIS — M24551 Contracture, right hip: Secondary | ICD-10-CM | POA: Diagnosis not present

## 2014-06-14 DIAGNOSIS — L8994 Pressure ulcer of unspecified site, stage 4: Secondary | ICD-10-CM | POA: Diagnosis not present

## 2014-06-17 ENCOUNTER — Ambulatory Visit (HOSPITAL_COMMUNITY): Payer: Medicare (Managed Care) | Attending: Internal Medicine

## 2014-06-17 DIAGNOSIS — R109 Unspecified abdominal pain: Secondary | ICD-10-CM | POA: Insufficient documentation

## 2014-06-17 DIAGNOSIS — R1900 Intra-abdominal and pelvic swelling, mass and lump, unspecified site: Secondary | ICD-10-CM | POA: Insufficient documentation

## 2014-06-18 NOTE — Progress Notes (Addendum)
Patient ID: [name], male   DOB: 1934-03-28, 81 y.o.   MRN: [id number]              PROGRESS NOTE  DATE:  06/14/2014       FACILITY: [facility]        LEVEL OF CARE:   SNF   Acute Visit   CHIEF COMPLAINT:  Follow up medical issues including abdominal ileus, infected right ischial tuberosity pressure ulcer.    HISTORY OF PRESENT ILLNESS:  I continue to follow this patient for a stage IV right ischial tuberosity wound that was complicated by Proteus infection with underlying osteomyelitis.  He has been on Zosyn since his arrival in the facility.  He underwent a series of surgical debridements and was started on a wound VAC in late February.  He has been making substantial progress.  He is now on Rocephin.  His inflammatory markers have come down.    He also developed marked abdominal distention.  Abdominal view revealed urinary bladder calculi and a colonic ileus.  There was moderate stool in the colon without frank obstruction.  I gave him MiraLAX as well as Dulcolax suppositories.  He is now apparently having clear fluid.    REVIEW OF SYSTEMS:   GENERAL:  He has not been running a fever.   CHEST/RESPIRATORY:  No cough.  No sputum.   GI:  No nausea or vomiting.       GU:  He has a Foley catheter in place to protect the wound itself.    PHYSICAL EXAMINATION:   GENERAL APPEARANCE:  The patient is not in any distress.   CHEST/RESPIRATORY:  Clear air entry bilaterally.    CARDIOVASCULAR:  CARDIAC:   Heart sounds are normal.  He appears to be euvolemic.   GASTROINTESTINAL:  ABDOMEN:   Still mildly distended, although his bowel sounds are much more active and there is much less distention than two weeks ago.   MUSCULOSKELETAL:   EXTREMITIES:   RIGHT LOWER EXTREMITY:  He still has the difficult flexion contracture involving the hip and knee on the right.   He was apparently measured for a brace.   SKIN:  INSPECTION:  Wound:   The area over the right ischial tuberosity  continues to look like it is closing down.  There is no evidence of infection.    ASSESSMENT/PLAN:                   Right ischial tuberosity pressure ulceration stage IV.  Complicated by Proteus infection.  He remains on Rocephin due to underlying osteomyelitis on the MRI.  The wound continues to have a wound VAC, which we have some difficulty keeping in place.  However, the wound itself looks considerably better, as does the infection.     Ileus.  I am going to stop the MiraLAX, change the Dulcolax to every second day.  I was worried that I may have to send him to GI, although I will put that off for now.    Flexion contractures, especially of the right leg which is exorotated and flexed at the hip and then flexed at the knee, resulting in the leg going under his other leg.  He has been measured for a brace.

## 2014-06-21 ENCOUNTER — Non-Acute Institutional Stay (SKILLED_NURSING_FACILITY): Payer: Medicare (Managed Care) | Admitting: Internal Medicine

## 2014-06-21 ENCOUNTER — Encounter (HOSPITAL_COMMUNITY)
Admission: RE | Admit: 2014-06-21 | Discharge: 2014-06-21 | Disposition: A | Discharge status: Home / Self Care | Payer: Medicare (Managed Care) | Source: Skilled Nursing Facility | Attending: Internal Medicine | Admitting: Internal Medicine

## 2014-06-21 DIAGNOSIS — K599 Functional intestinal disorder, unspecified: Secondary | ICD-10-CM | POA: Diagnosis not present

## 2014-06-21 DIAGNOSIS — L03317 Cellulitis of buttock: Secondary | ICD-10-CM | POA: Diagnosis not present

## 2014-06-21 DIAGNOSIS — L8994 Pressure ulcer of unspecified site, stage 4: Secondary | ICD-10-CM

## 2014-06-21 LAB — BASIC METABOLIC PANEL
ANION GAP: 6 (ref 5–15)
BUN: 20 mg/dL (ref 6–23)
CALCIUM: 8.6 mg/dL (ref 8.4–10.5)
CO2: 26 mmol/L (ref 19–32)
Chloride: 110 mmol/L (ref 96–112)
Creatinine, Ser: 0.75 mg/dL (ref 0.50–1.35)
GFR calc non Af Amer: 86 mL/min — ABNORMAL LOW (ref >90)
GLUCOSE: 91 mg/dL (ref 70–99)
POTASSIUM: 3.3 mmol/L — AB (ref 3.5–5.1)
Sodium: 142 mmol/L (ref 135–145)

## 2014-06-23 ENCOUNTER — Non-Acute Institutional Stay (SKILLED_NURSING_FACILITY): Payer: Medicare (Managed Care) | Admitting: Internal Medicine

## 2014-06-23 ENCOUNTER — Other Ambulatory Visit (HOSPITAL_COMMUNITY)
Admission: AD | Admit: 2014-06-23 | Discharge: 2014-06-23 | Disposition: A | Discharge status: Home / Self Care | Payer: Medicare (Managed Care) | Source: Skilled Nursing Facility | Attending: Internal Medicine | Admitting: Internal Medicine

## 2014-06-23 DIAGNOSIS — L8994 Pressure ulcer of unspecified site, stage 4: Secondary | ICD-10-CM

## 2014-06-23 DIAGNOSIS — K599 Functional intestinal disorder, unspecified: Secondary | ICD-10-CM

## 2014-06-23 LAB — BASIC METABOLIC PANEL
ANION GAP: 4 — AB (ref 5–15)
BUN: 22 mg/dL (ref 6–23)
CO2: 30 mmol/L (ref 19–32)
CREATININE: 0.78 mg/dL (ref 0.50–1.35)
Calcium: 9 mg/dL (ref 8.4–10.5)
Chloride: 108 mmol/L (ref 96–112)
GFR calc Af Amer: >90 mL/min (ref >90)
GFR, EST NON AFRICAN AMERICAN: 84 mL/min — AB (ref >90)
Glucose, Bld: 89 mg/dL (ref 70–99)
Potassium: 3.9 mmol/L (ref 3.5–5.1)
SODIUM: 142 mmol/L (ref 135–145)

## 2014-06-23 NOTE — Progress Notes (Addendum)
Patient ID: [name], male   DOB: 02/02/1934, 81 y.o.   MRN: [id number]                PROGRESS NOTE  DATE:  06/21/2014            FACILITY: [facility]                          LEVEL OF CARE:   SNF   Acute Visit                  CHIEF COMPLAINT:  Follow up abdominal ileus, infected ischial tuberosity ulcer.      HISTORY OF PRESENT ILLNESS:  This is a patient who came to [place] with a stage IV right ischial tuberosity wound that was complicated by Proteus infection with underlying osteomyelitis.  He had been on Zosyn after his arrival in the facility, which I then switched to Rocephin and discontinued his PICC line.  He was started on a wound VAC in late February.  He has been making slow, but steady, progress with really a marked improvement here.    He has, however, developed recurrent abdominal distention.  Initial x-ray suggested an impaction in the sigmoid.  I then gave him MiraLAX and this got considerably better.  However, once again, this is distended.  Follow-up x-ray showed continued ileus, but clearing of the sigmoid stool burden.    REVIEW OF SYSTEMS:    CHEST/RESPIRATORY:  No cough.   No sputum.     GI:  No nausea.  No vomiting.  However, he is complaining about the abdominal distention.    GU:  He has a Foley catheter to protect the wound bed.    PHYSICAL EXAMINATION:   CHEST/RESPIRATORY:  Exam is clear.         CARDIOVASCULAR:   CARDIAC:  Heart sounds are normal.  He appears to be euvolemic.        GASTROINTESTINAL:   ABDOMEN:  Still quite distended with high-pitched bowel sounds.  He has no tenderness.  No masses are noted.    SKIN:   INSPECTION:  The wound area continues to make progress and looks as though it is slowly improving after a fairly remarkable improvement initially.      ASSESSMENT/PLAN:                               Right ischial tuberosity pressure ulcer stage IV.  Complicated by a Proteus infection.  He remains on Rocephin, to complete  six weeks of antibiotics.  He will continue on the wound VAC.    Ileus.  His potassium here is 3.3.  I am assuming that some of this was from diarrhea, although I am not completely certain.  In any case, I want his potassium up to 4 and I am going to increase his KCl to 60 mEq b.i.d.   His magnesium has been previously checked and was normal.  He does not appear to be on any of the usual offenders when it comes to medication-induced issues with the colon.  If I am not successful soon, I am going to arrange for him to have an outpatient visit with GI, ?sigmoidoscopy to exclude a distal obstruction. Volvulus etc

## 2014-06-25 ENCOUNTER — Other Ambulatory Visit (HOSPITAL_COMMUNITY)
Admission: AD | Admit: 2014-06-25 | Discharge: 2014-06-25 | Disposition: A | Discharge status: Home / Self Care | Payer: Medicare (Managed Care) | Source: Skilled Nursing Facility | Attending: Internal Medicine | Admitting: Internal Medicine

## 2014-06-25 DIAGNOSIS — R69 Illness, unspecified: Secondary | ICD-10-CM | POA: Insufficient documentation

## 2014-06-25 LAB — POTASSIUM: Potassium: 3.9 mmol/L (ref 3.5–5.1)

## 2014-06-27 NOTE — Progress Notes (Signed)
Patient ID: [name], male   DOB: Aug 23, 1933, 81 y.o.   MRN: [id number]                PROGRESS NOTE  DATE:  06/23/2014               FACILITY: [facility]                           LEVEL OF CARE:   SNF   Acute Visit                                           CHIEF COMPLAINT:  I was asked to re-look at his right ischial tuberosity wound today by the wound care nurse.      HISTORY OF PRESENT ILLNESS:  I had previously seen this wound two days ago.   We have been using a wound VAC to this.  Miraculously, this wound has even gotten rapidly smaller over the last 48 hours.  This is probably now too small for a wound VAC.  We will change to a silver alginate dressing.    The second problem is that of a refractory ileus that was initially complicated by impaction.  I have gotten his potassium up to 3.9.  He is on MiraLAX and Dulcolax.    PHYSICAL EXAMINATION:   GASTROINTESTINAL:   ABDOMEN:  The abdomen is much less distended.  Bowel sounds are present.  In going over things with the patient, it appears that this was problematic at home, as well.   SKIN:   INSPECTION:  Right ischial tuberosity wound.  Small, as noted above.  No obvious infection.    ASSESSMENT/PLAN:                        Stage IV wound.  This is now down to a very small wound.  It is not VAC'able.  We will use a silver alginate packing.    Ileus.  This is improved.  I do not think this is all due to his hypokalemia as it was not significant enough.  However, I will reduce the potassium and follow clinically.  He may actually need a sigmoidoscopy at some point to exclude some form of distal obstruction.     CPT CODE: 85885

## 2014-06-28 ENCOUNTER — Encounter (HOSPITAL_COMMUNITY)
Admission: RE | Admit: 2014-06-28 | Discharge: 2014-06-28 | Disposition: A | Discharge status: Home / Self Care | Payer: Medicare (Managed Care) | Source: Skilled Nursing Facility | Attending: Internal Medicine | Admitting: Internal Medicine

## 2014-06-28 LAB — BASIC METABOLIC PANEL
Anion gap: 6 (ref 5–15)
BUN: 28 mg/dL — ABNORMAL HIGH (ref 6–23)
CALCIUM: 8.7 mg/dL (ref 8.4–10.5)
CO2: 25 mmol/L (ref 19–32)
Chloride: 109 mmol/L (ref 96–112)
Creatinine, Ser: 0.92 mg/dL (ref 0.50–1.35)
GFR calc Af Amer: >90 mL/min (ref >90)
GFR calc non Af Amer: 78 mL/min — ABNORMAL LOW (ref >90)
GLUCOSE: 89 mg/dL (ref 70–99)
Potassium: 3.9 mmol/L (ref 3.5–5.1)
SODIUM: 140 mmol/L (ref 135–145)

## 2014-06-30 ENCOUNTER — Non-Acute Institutional Stay (SKILLED_NURSING_FACILITY): Payer: Medicare (Managed Care) | Admitting: Internal Medicine

## 2014-06-30 ENCOUNTER — Other Ambulatory Visit (HOSPITAL_COMMUNITY)
Admission: AD | Admit: 2014-06-30 | Discharge: 2014-06-30 | Disposition: A | Discharge status: Home / Self Care | Payer: Medicare (Managed Care) | Source: Skilled Nursing Facility | Attending: Internal Medicine | Admitting: Internal Medicine

## 2014-06-30 DIAGNOSIS — K599 Functional intestinal disorder, unspecified: Secondary | ICD-10-CM

## 2014-06-30 DIAGNOSIS — L8994 Pressure ulcer of unspecified site, stage 4: Secondary | ICD-10-CM

## 2014-06-30 DIAGNOSIS — M24551 Contracture, right hip: Secondary | ICD-10-CM | POA: Diagnosis not present

## 2014-06-30 LAB — BASIC METABOLIC PANEL
Anion gap: 6 (ref 5–15)
BUN: 31 mg/dL — ABNORMAL HIGH (ref 6–23)
CHLORIDE: 108 mmol/L (ref 96–112)
CO2: 27 mmol/L (ref 19–32)
Calcium: 9 mg/dL (ref 8.4–10.5)
Creatinine, Ser: 1.16 mg/dL (ref 0.50–1.35)
GFR calc Af Amer: 67 mL/min — ABNORMAL LOW (ref >90)
GFR calc non Af Amer: 58 mL/min — ABNORMAL LOW (ref >90)
GLUCOSE: 94 mg/dL (ref 70–99)
POTASSIUM: 3.7 mmol/L (ref 3.5–5.1)
SODIUM: 141 mmol/L (ref 135–145)

## 2014-07-01 ENCOUNTER — Encounter (HOSPITAL_COMMUNITY)
Admission: RE | Admit: 2014-07-01 | Discharge: 2014-07-01 | Disposition: A | Discharge status: Home / Self Care | Payer: Medicare (Managed Care) | Source: Skilled Nursing Facility | Attending: Internal Medicine | Admitting: Internal Medicine

## 2014-07-02 ENCOUNTER — Other Ambulatory Visit (HOSPITAL_COMMUNITY)
Admission: AD | Admit: 2014-07-02 | Discharge: 2014-07-02 | Disposition: A | Discharge status: Home / Self Care | Payer: Medicare (Managed Care) | Source: Skilled Nursing Facility | Attending: Internal Medicine | Admitting: Internal Medicine

## 2014-07-03 ENCOUNTER — Encounter (HOSPITAL_COMMUNITY)
Admission: RE | Admit: 2014-07-03 | Discharge: 2014-07-03 | Disposition: A | Discharge status: Home / Self Care | Payer: Medicare (Managed Care) | Source: Skilled Nursing Facility | Attending: Internal Medicine | Admitting: Internal Medicine

## 2014-07-03 LAB — OCCULT BLOOD X 1 CARD TO LAB, STOOL: Fecal Occult Bld: NEGATIVE

## 2014-07-05 ENCOUNTER — Encounter (HOSPITAL_COMMUNITY)
Admission: RE | Admit: 2014-07-05 | Discharge: 2014-07-05 | Disposition: A | Discharge status: Home / Self Care | Payer: Medicare (Managed Care) | Source: Skilled Nursing Facility | Attending: Internal Medicine | Admitting: Internal Medicine

## 2014-07-05 LAB — BASIC METABOLIC PANEL
ANION GAP: 7 (ref 5–15)
BUN: 22 mg/dL (ref 6–23)
CALCIUM: 8.8 mg/dL (ref 8.4–10.5)
CO2: 24 mmol/L (ref 19–32)
Chloride: 111 mmol/L (ref 96–112)
Creatinine, Ser: 0.84 mg/dL (ref 0.50–1.35)
GFR calc Af Amer: >90 mL/min (ref >90)
GFR calc non Af Amer: 81 mL/min — ABNORMAL LOW (ref >90)
GLUCOSE: 103 mg/dL — AB (ref 70–99)
Potassium: 3.5 mmol/L (ref 3.5–5.1)
Sodium: 142 mmol/L (ref 135–145)

## 2014-07-05 LAB — CBC WITH DIFFERENTIAL/PLATELET
Basophils Absolute: 0 10*3/uL (ref 0.0–0.1)
Basophils Relative: 0 % (ref 0–1)
Eosinophils Absolute: 0.5 10*3/uL (ref 0.0–0.7)
Eosinophils Relative: 10 % — ABNORMAL HIGH (ref 0–5)
HCT: 38.4 % — ABNORMAL LOW (ref 39.0–52.0)
HEMOGLOBIN: 12 g/dL — AB (ref 13.0–17.0)
Lymphocytes Relative: 35 % (ref 12–46)
Lymphs Abs: 1.7 10*3/uL (ref 0.7–4.0)
MCH: 27.4 pg (ref 26.0–34.0)
MCHC: 31.3 g/dL (ref 30.0–36.0)
MCV: 87.7 fL (ref 78.0–100.0)
MONOS PCT: 9 % (ref 3–12)
Monocytes Absolute: 0.4 10*3/uL (ref 0.1–1.0)
NEUTROS PCT: 46 % (ref 43–77)
Neutro Abs: 2.2 10*3/uL (ref 1.7–7.7)
Platelets: 252 10*3/uL (ref 150–400)
RBC: 4.38 MIL/uL (ref 4.22–5.81)
RDW: 16.5 % — AB (ref 11.5–15.5)
WBC: 4.9 10*3/uL (ref 4.0–10.5)

## 2014-07-06 LAB — STOOL CULTURE

## 2014-07-10 NOTE — Progress Notes (Addendum)
Patient ID: [name], male   DOB: 10/16/1933, 81 y.o.   MRN: [id number]                PROGRESS NOTE  DATE:  06/30/2014            FACILITY: [facility]        LEVEL OF CARE:   SNF   Routine Visit   CHIEF COMPLAINT:  Follow up medical issues.      HISTORY OF PRESENT ILLNESS:  [name] came here with an infected stage IV right ischial tuberosity wound that was complicated by a Proteus infection and underlying osteomyelitis.  He required a series of difficult surgical debridements.  I initially treated him with Zosyn, transitioning him to Rocephin with the idea that he would soon be discharged.  For one reason or another, that does not seem to have happened.  He remains on Rocephin.  He should be on antibiotics for six weeks.  His inflammatory markers came down nicely.  With the wound VAC, the wound has also progressed really better than I could have hoped and is now a small open area which is mostly filled in.  We discontinued the wound VAC and we are using a silver alginate rope.  I think this is improving each time I see it.    His other medical issues have become more problematic.  First of all, he seems to have a recurrent colonic ileus which I have attempted to manage by correcting his electrolytes (potassium), also giving him routine laxatives.  Plain x-rays here do not seem to have shown any evidence of obstruction although that type of thing, i.e. a distal obstruction, is always a concern in this, especially a sigmoid volvulus which can become an intermittent thing.    A second problem is marked contractures of the right leg at the hip/knee, causing the leg itself to go underneath the left leg.  We have a brace for this.    Finally, this man's care needs are really very extensive.  I wonder whether his wife is going to manage even with the assistance of PACE.     CURRENT MEDICATIONS:  Medication list is reviewed.    LABORATORY DATA:   Lab work from 06/30/2014:   Sodium 141, potassium 3.7, CO2 27, BUN 31, creatinine 1.16.    Last albumin I checked was on 06/09/2014.  That had improved from 2.4 to 2.9.    Last CBC on 06/07/2014 showed a hemoglobin of 11 and a white count of 5.2.    REVIEW OF SYSTEMS:    GENERAL:  The patient has remained systemically well.   CHEST/RESPIRATORY:  No cough.  No shortness of breath.   CARDIAC:  No chest pain.   GI:  No clear abdominal pain.  No nausea or vomiting.  He is having multiple bowel movements, although I have him on an aggressive regimen to try and make sure the ileus has mobilized.   GU:  He has a Foley catheter in place.    PHYSICAL EXAMINATION:   GENERAL APPEARANCE:  The patient is not in any distress.  Friendly and cooperative, as usual.   CHEST/RESPIRATORY:   A few crackles in the right lower lobe.  Left lung is clear.  There is no wheezing.   CARDIOVASCULAR:   CARDIAC:  Heart sounds are normal.  He appears to be euvolemic.   GASTROINTESTINAL:   ABDOMEN:  His abdomen is distended, but not as bad as I have  seen it on some occasions.  Bowel sounds are very rare and high-pitched, "tiny sounds".   RECTAL:  I did not perform this today.  He is incontinent of a large amount of liquid stool.   WOUND EXAM:  Again, the area over the right ischial tuberosity continues to improve without evidence of infection.   MUSCULOSKELETAL:   EXTREMITIES:   RIGHT LOWER EXTREMITY:  Right leg:  Once again, it is heavily contractured at the hip and knee.  This would be a very difficult thing if this becomes fixed.  It is able to be straightened out manually.  I have not seen him with the new brace on.    ASSESSMENT/PLAN:                   Infected right ischial tuberosity wound.  See discussion above.  I think we are using silver alginate packing here with a foam cover.  From this point of view, he is ready for discharge.  The Rocephin started on 06/07/2014 and probably can stop at this point, at least after this week he will  have completed antibiotics.    Recurrent ileus.  Other than severe abdominal distention, this has not really been truly symptomatic although I wonder whether this has something to do with his protein calorie malnutrition that he came in with.  I have corrected his potassium.  I suspect this is due to fluid loss in the colon.  There is no other reason to believe another explanation is present.  Looking back through [facility], he had a colonoscopy in 2010.  I suspect he needs a sigmoidoscopy and/or a CT with rectal contrast to exclude a distal obstruction here.  He is previously known to [facility].  I would like to arrange this consultation.    Increasing contractures of the right leg.  This is going to add a difficulty at home.  I would hope that this does not become a permanent fixture.  It would make it very difficult for him to be easily transported in a wheelchair, for instance.    ?Discharge.  From my point of view, this man was ready for discharge several weeks ago, which does not seem to have happened.  I wonder about his wife's ability to care for him in the home environment, although I have not really had an attempt to discuss this.    I am going to discontinue the Rocephin after today's dose.                 ADDENDUM:  It seems to come up with frequency why I have not done a stool for C.diff on him.  This is largely because his initial x-rays of the abdomen showed an impaction in the sigmoid colon, which was not compatible with a diarrheal illness.  We cleared the impaction.  The ileus is still happening.  However, because he is at high risk for C.diff, I will check a specimen although I am doubtful.  I am also going to check a stool for occult blood.

## 2014-07-14 ENCOUNTER — Encounter: Payer: Self-pay | Admitting: Gastroenterology

## 2014-07-26 ENCOUNTER — Encounter (HOSPITAL_BASED_OUTPATIENT_CLINIC_OR_DEPARTMENT_OTHER): Payer: Medicare (Managed Care) | Attending: Plastic Surgery

## 2014-07-26 DIAGNOSIS — B964 Proteus (mirabilis) (morganii) as the cause of diseases classified elsewhere: Secondary | ICD-10-CM | POA: Insufficient documentation

## 2014-07-26 DIAGNOSIS — F329 Major depressive disorder, single episode, unspecified: Secondary | ICD-10-CM | POA: Diagnosis not present

## 2014-07-26 DIAGNOSIS — L0889 Other specified local infections of the skin and subcutaneous tissue: Secondary | ICD-10-CM | POA: Insufficient documentation

## 2014-07-26 DIAGNOSIS — G8220 Paraplegia, unspecified: Secondary | ICD-10-CM | POA: Insufficient documentation

## 2014-07-26 DIAGNOSIS — Z87891 Personal history of nicotine dependence: Secondary | ICD-10-CM | POA: Diagnosis not present

## 2014-07-26 DIAGNOSIS — M869 Osteomyelitis, unspecified: Secondary | ICD-10-CM | POA: Diagnosis not present

## 2014-07-26 DIAGNOSIS — K219 Gastro-esophageal reflux disease without esophagitis: Secondary | ICD-10-CM | POA: Insufficient documentation

## 2014-07-26 DIAGNOSIS — I10 Essential (primary) hypertension: Secondary | ICD-10-CM | POA: Insufficient documentation

## 2014-07-26 DIAGNOSIS — F1020 Alcohol dependence, uncomplicated: Secondary | ICD-10-CM | POA: Diagnosis not present

## 2014-07-26 DIAGNOSIS — M109 Gout, unspecified: Secondary | ICD-10-CM | POA: Insufficient documentation

## 2014-07-26 DIAGNOSIS — L89314 Pressure ulcer of right buttock, stage 4: Secondary | ICD-10-CM | POA: Diagnosis present

## 2014-08-19 ENCOUNTER — Encounter (HOSPITAL_BASED_OUTPATIENT_CLINIC_OR_DEPARTMENT_OTHER): Payer: Medicare (Managed Care) | Attending: Internal Medicine

## 2014-08-19 DIAGNOSIS — L89313 Pressure ulcer of right buttock, stage 3: Secondary | ICD-10-CM | POA: Insufficient documentation

## 2014-08-19 DIAGNOSIS — I10 Essential (primary) hypertension: Secondary | ICD-10-CM | POA: Insufficient documentation

## 2014-08-19 DIAGNOSIS — G8220 Paraplegia, unspecified: Secondary | ICD-10-CM | POA: Diagnosis not present

## 2014-09-01 ENCOUNTER — Encounter: Payer: Self-pay | Admitting: *Deleted

## 2014-09-02 ENCOUNTER — Ambulatory Visit (INDEPENDENT_AMBULATORY_CARE_PROVIDER_SITE_OTHER): Payer: Medicare (Managed Care) | Admitting: Physician Assistant

## 2014-09-02 ENCOUNTER — Ambulatory Visit
Admission: RE | Admit: 2014-09-02 | Discharge: 2014-09-02 | Disposition: A | Discharge status: Home / Self Care | Payer: Medicare (Managed Care) | Source: Ambulatory Visit | Attending: Physician Assistant | Admitting: Physician Assistant

## 2014-09-02 ENCOUNTER — Encounter: Payer: Self-pay | Admitting: Physician Assistant

## 2014-09-02 VITALS — BP 142/78 | HR 56 | Ht 75.0 in | Wt 195.0 lb

## 2014-09-02 DIAGNOSIS — Z86010 Personal history of colonic polyps: Secondary | ICD-10-CM | POA: Diagnosis not present

## 2014-09-02 DIAGNOSIS — K5900 Constipation, unspecified: Secondary | ICD-10-CM | POA: Diagnosis not present

## 2014-09-02 DIAGNOSIS — K5909 Other constipation: Secondary | ICD-10-CM
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
## 2014-09-02 NOTE — Progress Notes (Signed)
i agree with the above note, plan 

## 2014-09-02 NOTE — Patient Instructions (Addendum)
Take Miralax 17 frams daily as needed. Prune juice daily.  Enema if no bowel movement in 3 days.   Today your blood pressure was elevated. Please follow up with your Primary Care Provider for blood pressure management.

## 2014-09-09 ENCOUNTER — Encounter (HOSPITAL_BASED_OUTPATIENT_CLINIC_OR_DEPARTMENT_OTHER): Payer: Medicare (Managed Care) | Attending: Internal Medicine

## 2014-09-09 DIAGNOSIS — Z87440 Personal history of urinary (tract) infections: Secondary | ICD-10-CM | POA: Diagnosis not present

## 2014-09-09 DIAGNOSIS — K219 Gastro-esophageal reflux disease without esophagitis: Secondary | ICD-10-CM | POA: Diagnosis not present

## 2014-09-09 DIAGNOSIS — L89314 Pressure ulcer of right buttock, stage 4: Secondary | ICD-10-CM | POA: Insufficient documentation

## 2014-09-09 DIAGNOSIS — Z8639 Personal history of other endocrine, nutritional and metabolic disease: Secondary | ICD-10-CM | POA: Diagnosis not present

## 2014-09-09 DIAGNOSIS — I10 Essential (primary) hypertension: Secondary | ICD-10-CM | POA: Insufficient documentation

## 2014-09-09 DIAGNOSIS — G8220 Paraplegia, unspecified: Secondary | ICD-10-CM | POA: Insufficient documentation

## 2014-09-09 DIAGNOSIS — Z8739 Personal history of other diseases of the musculoskeletal system and connective tissue: Secondary | ICD-10-CM | POA: Diagnosis not present

## 2014-09-23 DIAGNOSIS — I10 Essential (primary) hypertension: Secondary | ICD-10-CM | POA: Diagnosis not present

## 2014-09-23 DIAGNOSIS — G8220 Paraplegia, unspecified: Secondary | ICD-10-CM | POA: Diagnosis not present

## 2014-09-23 DIAGNOSIS — Z8739 Personal history of other diseases of the musculoskeletal system and connective tissue: Secondary | ICD-10-CM | POA: Diagnosis not present

## 2014-09-23 DIAGNOSIS — L89314 Pressure ulcer of right buttock, stage 4: Secondary | ICD-10-CM | POA: Diagnosis not present

## 2014-10-07 DIAGNOSIS — G8220 Paraplegia, unspecified: Secondary | ICD-10-CM | POA: Diagnosis not present

## 2014-10-07 DIAGNOSIS — Z8739 Personal history of other diseases of the musculoskeletal system and connective tissue: Secondary | ICD-10-CM | POA: Diagnosis not present

## 2014-10-07 DIAGNOSIS — I10 Essential (primary) hypertension: Secondary | ICD-10-CM | POA: Diagnosis not present

## 2014-10-07 DIAGNOSIS — L89314 Pressure ulcer of right buttock, stage 4: Secondary | ICD-10-CM | POA: Diagnosis not present

## 2014-10-21 ENCOUNTER — Encounter (HOSPITAL_BASED_OUTPATIENT_CLINIC_OR_DEPARTMENT_OTHER): Payer: Medicare (Managed Care) | Attending: Internal Medicine

## 2014-10-21 DIAGNOSIS — L89313 Pressure ulcer of right buttock, stage 3: Secondary | ICD-10-CM | POA: Insufficient documentation

## 2014-10-21 DIAGNOSIS — G8220 Paraplegia, unspecified: Secondary | ICD-10-CM | POA: Diagnosis not present

## 2014-10-21 DIAGNOSIS — I10 Essential (primary) hypertension: Secondary | ICD-10-CM | POA: Insufficient documentation

## 2014-11-04 DIAGNOSIS — I10 Essential (primary) hypertension: Secondary | ICD-10-CM | POA: Diagnosis not present

## 2014-11-04 DIAGNOSIS — G8220 Paraplegia, unspecified: Secondary | ICD-10-CM | POA: Diagnosis not present

## 2014-11-04 DIAGNOSIS — L89313 Pressure ulcer of right buttock, stage 3: Secondary | ICD-10-CM | POA: Diagnosis not present

## 2014-11-18 ENCOUNTER — Encounter (HOSPITAL_BASED_OUTPATIENT_CLINIC_OR_DEPARTMENT_OTHER): Payer: Medicare (Managed Care) | Attending: Internal Medicine

## 2014-11-18 DIAGNOSIS — G8220 Paraplegia, unspecified: Secondary | ICD-10-CM | POA: Diagnosis not present

## 2014-11-18 DIAGNOSIS — L89313 Pressure ulcer of right buttock, stage 3: Secondary | ICD-10-CM | POA: Insufficient documentation

## 2014-11-18 DIAGNOSIS — I10 Essential (primary) hypertension: Secondary | ICD-10-CM | POA: Insufficient documentation

## 2014-12-02 DIAGNOSIS — G8220 Paraplegia, unspecified: Secondary | ICD-10-CM | POA: Diagnosis not present

## 2014-12-02 DIAGNOSIS — L89313 Pressure ulcer of right buttock, stage 3: Secondary | ICD-10-CM | POA: Diagnosis not present

## 2014-12-02 DIAGNOSIS — I10 Essential (primary) hypertension: Secondary | ICD-10-CM | POA: Diagnosis not present

## 2014-12-16 ENCOUNTER — Encounter (HOSPITAL_BASED_OUTPATIENT_CLINIC_OR_DEPARTMENT_OTHER): Payer: Medicare (Managed Care) | Attending: Internal Medicine

## 2014-12-16 DIAGNOSIS — G8220 Paraplegia, unspecified: Secondary | ICD-10-CM | POA: Diagnosis not present

## 2014-12-16 DIAGNOSIS — I10 Essential (primary) hypertension: Secondary | ICD-10-CM | POA: Insufficient documentation

## 2014-12-16 DIAGNOSIS — L89313 Pressure ulcer of right buttock, stage 3: Secondary | ICD-10-CM | POA: Insufficient documentation

## 2014-12-17 ENCOUNTER — Encounter: Payer: Self-pay | Admitting: Neurology

## 2014-12-17 ENCOUNTER — Ambulatory Visit (INDEPENDENT_AMBULATORY_CARE_PROVIDER_SITE_OTHER): Payer: Medicare (Managed Care) | Admitting: Neurology

## 2014-12-17 VITALS — BP 136/82 | HR 78

## 2014-12-17 DIAGNOSIS — G8389 Other specified paralytic syndromes: Secondary | ICD-10-CM

## 2014-12-17 DIAGNOSIS — IMO0002 Reserved for concepts with insufficient information to code with codable children: Secondary | ICD-10-CM

## 2014-12-17 DIAGNOSIS — G8250 Quadriplegia, unspecified: Secondary | ICD-10-CM | POA: Insufficient documentation

## 2014-12-17 DIAGNOSIS — F0390 Unspecified dementia without behavioral disturbance: Secondary | ICD-10-CM
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
## 2014-12-17 NOTE — Patient Instructions (Signed)
Please bring family member at next visit for potential EMG guided botulinum toxin injections

## 2014-12-30 DIAGNOSIS — L89313 Pressure ulcer of right buttock, stage 3: Secondary | ICD-10-CM | POA: Diagnosis not present

## 2014-12-30 DIAGNOSIS — I10 Essential (primary) hypertension: Secondary | ICD-10-CM | POA: Diagnosis not present

## 2014-12-30 DIAGNOSIS — G8220 Paraplegia, unspecified: Secondary | ICD-10-CM | POA: Diagnosis not present

## 2015-01-13 ENCOUNTER — Encounter (HOSPITAL_BASED_OUTPATIENT_CLINIC_OR_DEPARTMENT_OTHER): Payer: Medicare (Managed Care) | Attending: Internal Medicine

## 2015-01-13 DIAGNOSIS — G8220 Paraplegia, unspecified: Secondary | ICD-10-CM | POA: Insufficient documentation

## 2015-01-13 DIAGNOSIS — L89314 Pressure ulcer of right buttock, stage 4: Secondary | ICD-10-CM | POA: Diagnosis not present

## 2015-01-13 DIAGNOSIS — H269 Unspecified cataract: Secondary | ICD-10-CM | POA: Insufficient documentation

## 2015-01-13 DIAGNOSIS — K219 Gastro-esophageal reflux disease without esophagitis: Secondary | ICD-10-CM | POA: Diagnosis not present

## 2015-01-13 DIAGNOSIS — I10 Essential (primary) hypertension: Secondary | ICD-10-CM | POA: Insufficient documentation

## 2015-01-13 DIAGNOSIS — M109 Gout, unspecified: Secondary | ICD-10-CM | POA: Insufficient documentation

## 2015-01-13 DIAGNOSIS — M869 Osteomyelitis, unspecified: Secondary | ICD-10-CM | POA: Diagnosis not present

## 2015-01-25 ENCOUNTER — Telehealth: Payer: Self-pay | Admitting: Neurology

## 2015-01-25 ENCOUNTER — Other Ambulatory Visit: Payer: Self-pay | Admitting: *Deleted

## 2015-01-25 DIAGNOSIS — G8114 Spastic hemiplegia affecting left nondominant side: Secondary | ICD-10-CM

## 2015-01-25 MED ORDER — INCOBOTULINUMTOXINA 100 UNITS IM SOLR: INTRAMUSCULAR | Status: DC

## 2015-01-25 NOTE — Telephone Encounter (Signed)
Maureen[facility] called. Please call [phone]. Has spoken to [facility] and they are able to get Xeomin filled and dispensed to our office. [facility] needs to hear from our end.

## 2015-01-25 NOTE — Telephone Encounter (Signed)
Called and spoke to [place] at the [facility] she relayed that she would call and get his xeomin shipped because of the program he is in. Gwenette Greet is the supervisor at [facility] telephone [phone] and fax number  [phone]. I will fax Georges Lynch for xeomin . Gwenette Greet is aware of apt on 02/01/2015 with Dr.Yan. I relayed to Gwenette Greet it will be good if xeomin can be shipped by Friday. I will call Gwenette Greet back on Thursday 01-2015.

## 2015-01-26 NOTE — Telephone Encounter (Signed)
Called and spoke to [name] her RX we are not doing buy and bill. We will try and get him set up with another facility.

## 2015-01-26 NOTE — Telephone Encounter (Signed)
[name], please explained to the patient, if he still wish to proceed with Botox treatment for his spasticity, our office will refer him to rehabilitation for injection

## 2015-01-26 NOTE — Telephone Encounter (Signed)
Spoke to [facility] and they will ship medication to use we will have to do [place] for him Only. Medication will be here by Friday.

## 2015-01-26 NOTE — Telephone Encounter (Signed)
Spoke with Gwenette Greet and she stated that we need to call [facility] to order the patients medication. [phone].

## 2015-01-27 DIAGNOSIS — G8220 Paraplegia, unspecified: Secondary | ICD-10-CM | POA: Diagnosis not present

## 2015-01-27 DIAGNOSIS — H269 Unspecified cataract: Secondary | ICD-10-CM | POA: Diagnosis not present

## 2015-01-27 DIAGNOSIS — I10 Essential (primary) hypertension: Secondary | ICD-10-CM | POA: Diagnosis not present

## 2015-01-27 DIAGNOSIS — L89314 Pressure ulcer of right buttock, stage 4: Secondary | ICD-10-CM | POA: Diagnosis not present

## 2015-02-01 ENCOUNTER — Encounter: Payer: Self-pay | Admitting: Neurology

## 2015-02-01 ENCOUNTER — Ambulatory Visit (INDEPENDENT_AMBULATORY_CARE_PROVIDER_SITE_OTHER): Payer: Medicare (Managed Care) | Admitting: Neurology

## 2015-02-01 ENCOUNTER — Encounter: Payer: Self-pay | Admitting: *Deleted

## 2015-02-01 VITALS — BP 142/68 | HR 82

## 2015-02-01 DIAGNOSIS — G8220 Paraplegia, unspecified: Secondary | ICD-10-CM

## 2015-02-01 MED ORDER — INCOBOTULINUMTOXINA 100 UNITS IM SOLR
100.0000 [IU] | Freq: Once | INTRAMUSCULAR | Status: AC
  Administered 2015-02-01: 100 [IU] via INTRAMUSCULAR
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
## 2015-02-01 NOTE — Progress Notes (Signed)
**  Xeomin 100 units x 1, Lot [id number], Exp 05/2016, [place] [id number], office supply**mck,rn

## 2015-02-10 ENCOUNTER — Encounter (HOSPITAL_BASED_OUTPATIENT_CLINIC_OR_DEPARTMENT_OTHER): Payer: Medicare (Managed Care) | Attending: Internal Medicine

## 2015-02-10 DIAGNOSIS — G8220 Paraplegia, unspecified: Secondary | ICD-10-CM | POA: Diagnosis not present

## 2015-02-10 DIAGNOSIS — I10 Essential (primary) hypertension: Secondary | ICD-10-CM | POA: Diagnosis not present

## 2015-02-10 DIAGNOSIS — H269 Unspecified cataract: Secondary | ICD-10-CM | POA: Diagnosis not present

## 2015-02-10 DIAGNOSIS — M109 Gout, unspecified: Secondary | ICD-10-CM | POA: Diagnosis not present

## 2015-02-10 DIAGNOSIS — K219 Gastro-esophageal reflux disease without esophagitis: Secondary | ICD-10-CM | POA: Insufficient documentation

## 2015-02-10 DIAGNOSIS — L89313 Pressure ulcer of right buttock, stage 3: Secondary | ICD-10-CM | POA: Diagnosis present

## 2015-02-18 ENCOUNTER — Inpatient Hospital Stay
Admission: RE | Admit: 2015-02-18 | Discharge: 2015-03-23 | Disposition: A | Discharge status: Home / Self Care | Payer: Medicare (Managed Care) | Source: Ambulatory Visit | Attending: Internal Medicine | Admitting: Internal Medicine

## 2015-02-18 DIAGNOSIS — K567 Ileus, unspecified: Principal | ICD-10-CM

## 2015-02-21 ENCOUNTER — Other Ambulatory Visit: Payer: Self-pay | Admitting: *Deleted

## 2015-02-21 ENCOUNTER — Non-Acute Institutional Stay (SKILLED_NURSING_FACILITY): Payer: Medicare (Managed Care) | Admitting: Internal Medicine

## 2015-02-21 DIAGNOSIS — E876 Hypokalemia: Secondary | ICD-10-CM | POA: Diagnosis not present

## 2015-02-21 DIAGNOSIS — M24551 Contracture, right hip: Secondary | ICD-10-CM | POA: Diagnosis not present

## 2015-02-21 DIAGNOSIS — K599 Functional intestinal disorder, unspecified: Secondary | ICD-10-CM

## 2015-02-21 DIAGNOSIS — L8994 Pressure ulcer of unspecified site, stage 4: Secondary | ICD-10-CM

## 2015-02-21 MED ORDER — ZOLPIDEM TARTRATE 5 MG PO TABS: 5.0000 mg | ORAL_TABLET | Freq: Every evening | ORAL | Status: AC | PRN

## 2015-02-21 NOTE — Telephone Encounter (Signed)
[facility] 
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
## 2015-03-14 ENCOUNTER — Non-Acute Institutional Stay (SKILLED_NURSING_FACILITY): Payer: Medicare (Managed Care) | Admitting: Internal Medicine

## 2015-03-14 DIAGNOSIS — L8994 Pressure ulcer of unspecified site, stage 4: Secondary | ICD-10-CM

## 2015-03-15 NOTE — Progress Notes (Addendum)
Patient ID: [name], male   DOB: [birth year], 81 y.o.   MRN: [id number]                PROGRESS NOTE  DATE:  03/14/2015          FACILITY: [facility]                  LEVEL OF CARE:   SNF   Acute Visit            CHIEF COMPLAINT:  Follow up pressure ulcer.      HISTORY OF PRESENT ILLNESS:  I admitted [name] to the building on 02/21/2015.    He has had a refractory pressure ulcer over his right ischial tuberosity dating back to the early parts of this year.  Initially, this was a deep stage IV wound with underlying osteomyelitis.  He was treated for this with IV antibiotics.  His inflammatory markers had remained stable.  Although this did get very small, this simply did not close.  I think most of this was probably a pressure relief issue.    The facility initially measured this depth at 3.8 cm on arrival here.  I have looked through the wound care center's computer system.  I, surprisingly, cannot find an actual wound depth here.  In any case, the wound orifice is now very, very tiny, probably too tiny to wound VAC.   If I were to open this, I would be cutting through normal skin.  Although that is not out of the question, I would prefer at this point to use silver alginate rope packing and see if we can get closure that way with him in the facility.     PHYSICAL EXAMINATION:   SKIN:   INSPECTION:    the wound is 2.8 cm in depth with perhaps 0.5 cm of circumference at the opening. This is an improvement There is absolutely no drainage here.  No evidence of infection.    ASSESSMENT/PLAN:   What was a stage IV pressure ulcer at some point now is probably a stage III wound.  This is definitely too small to VAC.  I would switch to a silver alginate rope for now. I am hoping this will close with aa silver alginate dressing and adequate offloading. There is not any evidence of superficial or deep infection.

## 2015-03-21 ENCOUNTER — Ambulatory Visit (HOSPITAL_COMMUNITY)
Admit: 2015-03-21 | Discharge: 2015-03-21 | Disposition: A | Discharge status: Home / Self Care | Payer: Medicare (Managed Care) | Attending: Internal Medicine | Admitting: Internal Medicine

## 2015-03-21 ENCOUNTER — Non-Acute Institutional Stay (SKILLED_NURSING_FACILITY): Payer: Medicare (Managed Care) | Admitting: Internal Medicine

## 2015-03-21 ENCOUNTER — Encounter (HOSPITAL_COMMUNITY)
Admission: RE | Admit: 2015-03-21 | Discharge: 2015-03-21 | Disposition: A | Discharge status: Home / Self Care | Payer: Medicare (Managed Care) | Source: Skilled Nursing Facility | Attending: Internal Medicine | Admitting: Internal Medicine

## 2015-03-21 DIAGNOSIS — K599 Functional intestinal disorder, unspecified: Secondary | ICD-10-CM | POA: Diagnosis not present

## 2015-03-21 DIAGNOSIS — R410 Disorientation, unspecified: Secondary | ICD-10-CM | POA: Diagnosis not present

## 2015-03-21 DIAGNOSIS — N210 Calculus in bladder: Secondary | ICD-10-CM | POA: Diagnosis not present

## 2015-03-21 DIAGNOSIS — K567 Ileus, unspecified: Secondary | ICD-10-CM | POA: Insufficient documentation

## 2015-03-21 LAB — URINE MICROSCOPIC-ADD ON

## 2015-03-21 LAB — COMPREHENSIVE METABOLIC PANEL
ALT: 26 U/L (ref 17–63)
AST: 27 U/L (ref 15–41)
Albumin: 3.5 g/dL (ref 3.5–5.0)
Alkaline Phosphatase: 87 U/L (ref 38–126)
Anion gap: 5 (ref 5–15)
BUN: 30 mg/dL — AB (ref 6–20)
CHLORIDE: 108 mmol/L (ref 101–111)
CO2: 32 mmol/L (ref 22–32)
CREATININE: 1.14 mg/dL (ref 0.61–1.24)
Calcium: 9.1 mg/dL (ref 8.9–10.3)
GFR, EST NON AFRICAN AMERICAN: 59 mL/min — AB (ref >60)
Glucose, Bld: 95 mg/dL (ref 65–99)
POTASSIUM: 4 mmol/L (ref 3.5–5.1)
SODIUM: 145 mmol/L (ref 135–145)
Total Bilirubin: 0.5 mg/dL (ref 0.3–1.2)
Total Protein: 6.2 g/dL — ABNORMAL LOW (ref 6.5–8.1)

## 2015-03-21 LAB — CBC WITH DIFFERENTIAL/PLATELET
BASOS ABS: 0 10*3/uL (ref 0.0–0.1)
Basophils Relative: 1 %
EOS ABS: 0.3 10*3/uL (ref 0.0–0.7)
EOS PCT: 6 %
HCT: 40.8 % (ref 39.0–52.0)
Hemoglobin: 12.8 g/dL — ABNORMAL LOW (ref 13.0–17.0)
Lymphocytes Relative: 37 %
Lymphs Abs: 1.6 10*3/uL (ref 0.7–4.0)
MCH: 28.1 pg (ref 26.0–34.0)
MCHC: 31.4 g/dL (ref 30.0–36.0)
MCV: 89.5 fL (ref 78.0–100.0)
Monocytes Absolute: 0.5 10*3/uL (ref 0.1–1.0)
Monocytes Relative: 11 %
Neutro Abs: 2 10*3/uL (ref 1.7–7.7)
Neutrophils Relative %: 45 %
PLATELETS: 263 10*3/uL (ref 150–400)
RBC: 4.56 MIL/uL (ref 4.22–5.81)
RDW: 14.9 % (ref 11.5–15.5)
WBC: 4.4 10*3/uL (ref 4.0–10.5)

## 2015-03-21 LAB — URINALYSIS, ROUTINE W REFLEX MICROSCOPIC
Bilirubin Urine: NEGATIVE
Glucose, UA: NEGATIVE mg/dL
Ketones, ur: NEGATIVE mg/dL
Nitrite: POSITIVE — AB
PROTEIN: 30 mg/dL — AB
SPECIFIC GRAVITY, URINE: 1.015 (ref 1.005–1.030)
pH: 8.5 — ABNORMAL HIGH (ref 5.0–8.0)

## 2015-03-21 LAB — BRAIN NATRIURETIC PEPTIDE: B NATRIURETIC PEPTIDE 5: 55 pg/mL (ref 0.0–100.0)
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
## 2015-03-23 ENCOUNTER — Non-Acute Institutional Stay (SKILLED_NURSING_FACILITY): Payer: Medicare (Managed Care) | Admitting: Internal Medicine

## 2015-03-23 DIAGNOSIS — L8994 Pressure ulcer of unspecified site, stage 4: Secondary | ICD-10-CM

## 2015-03-23 DIAGNOSIS — K560 Paralytic ileus: Secondary | ICD-10-CM

## 2015-03-23 NOTE — Progress Notes (Signed)
Patient ID: [name], male   DOB: 01-11-1934, 81 y.o.   MRN: [id number] Facility; [facility] Chief complaint; predischarge review History; [name] was supposed to go home on Monday. I found him to have a massively distended abdomen with virtually absent bowel sounds. I started him on a more aggressive bowel regimen. This is not a new problem for him. I thought her earlier in the year with this issue that he deserved at least a sigmoidoscopy and I note that he was sent to gastroenterology and this was not done. In any case it it is most likely a paralytic ileus each will need to be aggressively monitored as an outpatient. Recent electrolytes including his potassium had been normal. The other issue was that he seemed confused. This was verified by his wife. When I was in the room he was talking about being out of the room or even out of the facility the day before. He was so convincing that I actually had one of the nurses following the weekend supervisor and he was not out of the room at all.  Both the above issues seem more stable today. His abdomen is less tense a few bowel sounds are noted. X-rays of the abdomen showed features consistent with a colonic ileus. Also notable for a large bladder diverticulum. Comprehensive metabolic panel and CBC were done on the 12th which were essentially normal. A urinalysis shows large leukocyte esterase, too numerous white cells and "positive nitrite". Culture is considered "too young to read"  Physical examination Gen. patient is not in any distress. Respiratory exam is clear. O2 sat 98% on room air. Respirations 20 and unlabored Cardiac heart sounds are normal pulses 98 Abdomen; as mentioned his abdomen is much less distended than on Monday but still has features of an ongoing ileus. Wound; this is much smaller. I would continue with these silver alginate rope packing with a foam cover this could be changed every 2-3 days. I am hopeful that this will now close  over. It is certainly much better than when he came in here and entirely too small to put a wound VAC on currently. I am left with the idea that this is probably all of pressure-relief issue since this is all we have done here versus when he is at home Mental status: He seems much clearer and more goal directed. Attention span is better.  Impression/plan #1 paralytic ileus see discussion above. I would discharge him on the duplex daily and MiraLAX 17 g twice a day. He probably needs to have 2-3 bowel movements per day if that's possible #2 question worry about early delirium one would wonder whether he has a UTI. Positive nitrite with suggests pseudomonas. At this stage my career that's the only differential that comes to mind although I'm sure there are others. I'm going to start him on ciprofloxacin 500 twice a day for 7 days for a possible UTI. #3 his right ischial tuberosity wound perhaps as 2-3 mm diameter at the orifice. I did not actually measure the depth today although on Monday at was 2 cm which had improved I would send him home with silver alginate rope as long as this seems possible easily get in covered with a foam dressing. If the silver alginate becomes difficult then Iodosorb ointment lightly into the wound with a foam cover would seem the next step

## 2015-03-26 LAB — URINE CULTURE: Culture: >=100000

## 2015-04-26 ENCOUNTER — Encounter (HOSPITAL_BASED_OUTPATIENT_CLINIC_OR_DEPARTMENT_OTHER): Payer: Medicare (Managed Care) | Attending: Internal Medicine

## 2015-04-26 DIAGNOSIS — M109 Gout, unspecified: Secondary | ICD-10-CM | POA: Insufficient documentation

## 2015-04-26 DIAGNOSIS — G8220 Paraplegia, unspecified: Secondary | ICD-10-CM | POA: Insufficient documentation

## 2015-04-26 DIAGNOSIS — L89313 Pressure ulcer of right buttock, stage 3: Secondary | ICD-10-CM | POA: Insufficient documentation

## 2015-04-26 DIAGNOSIS — I10 Essential (primary) hypertension: Secondary | ICD-10-CM | POA: Insufficient documentation

## 2015-05-10 DIAGNOSIS — L89313 Pressure ulcer of right buttock, stage 3: Secondary | ICD-10-CM | POA: Diagnosis not present

## 2015-05-18 ENCOUNTER — Telehealth: Payer: Self-pay | Admitting: Neurology

## 2015-05-18 NOTE — Telephone Encounter (Signed)
Dr Bradd Burner [phone] called said pt had botox injection into rt hamstring in 10/16 and he really had no response at all. He said pt asked if he should go back to Dr Krista Blue and have it again. Would it be of any benefit to the pt? Can leave him a VM.

## 2015-05-18 NOTE — Telephone Encounter (Signed)
I have talked with his primary care Dr. Jake Bathe, he only had injection once, denies significant response, Danielle, please arrange him to come back for xeomin injection, 300 units,

## 2015-05-19 NOTE — Telephone Encounter (Signed)
Patient has been scheduled

## 2015-05-24 ENCOUNTER — Encounter (HOSPITAL_BASED_OUTPATIENT_CLINIC_OR_DEPARTMENT_OTHER): Payer: Medicare (Managed Care) | Attending: Surgery

## 2015-05-24 DIAGNOSIS — L89313 Pressure ulcer of right buttock, stage 3: Secondary | ICD-10-CM | POA: Insufficient documentation

## 2015-05-24 DIAGNOSIS — I10 Essential (primary) hypertension: Secondary | ICD-10-CM | POA: Diagnosis not present

## 2015-05-24 DIAGNOSIS — G8220 Paraplegia, unspecified: Secondary | ICD-10-CM | POA: Insufficient documentation

## 2015-05-24 DIAGNOSIS — M109 Gout, unspecified: Secondary | ICD-10-CM | POA: Insufficient documentation

## 2015-06-07 DIAGNOSIS — L89313 Pressure ulcer of right buttock, stage 3: Secondary | ICD-10-CM | POA: Diagnosis not present

## 2015-06-10 ENCOUNTER — Ambulatory Visit (INDEPENDENT_AMBULATORY_CARE_PROVIDER_SITE_OTHER): Payer: Medicare (Managed Care) | Admitting: Physician Assistant

## 2015-06-10 ENCOUNTER — Encounter: Payer: Self-pay | Admitting: Physician Assistant

## 2015-06-10 ENCOUNTER — Telehealth: Payer: Self-pay | Admitting: *Deleted

## 2015-06-10 VITALS — BP 104/62 | HR 80

## 2015-06-10 DIAGNOSIS — K315 Obstruction of duodenum: Secondary | ICD-10-CM | POA: Diagnosis not present

## 2015-06-10 DIAGNOSIS — R140 Abdominal distension (gaseous): Secondary | ICD-10-CM
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
## 2015-06-10 NOTE — Telephone Encounter (Signed)
Called Sonja at [facility] and advised her that Claudia Desanctis can take him on Mon 3-6 or Tues 3-7 to [facility] Radiology. They will do an XRay of the abdomen, Flat & Upright.  I advised Becky Sax it is an open order so he can go anytime [place] or Tues. She advised [facility] will get him to the hospital.

## 2015-06-10 NOTE — Patient Instructions (Signed)
Use Imodium only as needed. You do not need the Questran powder.  You have been scheduled for an abdominal X-Ray at [facility] Radiology (1st floor of hospital) . Go to patient registration inside front door of hospital then go to Radiology.   We will call you with the results.

## 2015-06-11 NOTE — Progress Notes (Signed)
i agree with the above note, plan 

## 2015-06-14 ENCOUNTER — Ambulatory Visit (HOSPITAL_COMMUNITY)
Admission: RE | Admit: 2015-06-14 | Discharge: 2015-06-14 | Disposition: A | Discharge status: Home / Self Care | Payer: Medicare (Managed Care) | Source: Ambulatory Visit | Attending: Physician Assistant | Admitting: Physician Assistant

## 2015-06-14 DIAGNOSIS — N210 Calculus in bladder: Secondary | ICD-10-CM | POA: Insufficient documentation

## 2015-06-14 DIAGNOSIS — R140 Abdominal distension (gaseous): Secondary | ICD-10-CM | POA: Insufficient documentation

## 2015-06-14 DIAGNOSIS — K315 Obstruction of duodenum: Secondary | ICD-10-CM

## 2015-06-14 DIAGNOSIS — K5641 Fecal impaction: Secondary | ICD-10-CM | POA: Diagnosis not present

## 2015-06-15 ENCOUNTER — Encounter: Payer: Self-pay | Admitting: Podiatry

## 2015-06-15 ENCOUNTER — Ambulatory Visit (INDEPENDENT_AMBULATORY_CARE_PROVIDER_SITE_OTHER): Payer: Medicare (Managed Care) | Admitting: Podiatry

## 2015-06-15 VITALS — BP 142/86 | HR 69 | Resp 12

## 2015-06-15 DIAGNOSIS — B351 Tinea unguium: Secondary | ICD-10-CM

## 2015-06-15 NOTE — Patient Instructions (Signed)
Today this patient was unsure as to why he was presented to our office today. He is aware that a topical medication was applied to his toenails. I saw no open wounds today Pulses were palpable The nails were distorted and I trim the nails 6-10 and toenails 3, 5 left slough thereafter trimming Return as needed

## 2015-06-15 NOTE — Progress Notes (Signed)
   Subjective:    Patient ID: [name], male    DOB: December 27, 1933, 82 y.o.   MRN: [id number]  HPI     This patient presents to our office today seated in a wheelchair and unable to transfer to the treatment chair. He was questioned as to why he was here today, however, he was unsure as to the exact nature of the referral to our office. He does have some recollection about applying medicine applied topically to his toenails. There is no representative from the facility orders home or relative where patient resides  Patient has a history of peripheral neuropathy and paraplegia and spasticity  Review of Systems  Skin: Positive for color change.       Objective:   Physical Exam  Patient appears pleasant but confused, however does respond to direct questioning  Vascular: Pitting edema left more than right DP and PT pulses 2/4 bilaterally Capillary fill is immediate bilaterally  Neurological: Sensation to 10 g monofilament wire intact 0/5 right and 1/5 left Vibratory sensation nonreactive bilaterally Ankle reflexes nonreactive bilaterally Patient has spastic right lower extremity  Dermatological: No open skin lesions bilaterally Slight eschar distal left hallux without any surrounding erythema, edema, warmth or drainage Toenails 6-10 are covered with a yellow lacquer like covering. All the toenails are hypertrophic elongated and deformed  Musculoskeletal: HAV deformities bilaterally Hammertoe 1, 2 left Nonambulatory Spastic lower extremities bilaterally       Assessment & Plan:   Assessment: Satisfactory vascular status Peripheral neuropathy and quadriplegia Deformed toenails 6-10 and seen to be most consistent with mycotic toenails infection  Plan: Today the best I could determine was that there was some concern about the toenails on the right and left feet The toenails were debrided mechanically and likely without any bleeding. Toenails 3, 5 left sloughed after  debridement. The toenail beds 3, 5 left demonstrate no erythema, edema, drainage  No follow-up requested and less patient or patient's representative presents with patient again with a specific concern

## 2015-06-23 ENCOUNTER — Encounter (HOSPITAL_BASED_OUTPATIENT_CLINIC_OR_DEPARTMENT_OTHER): Payer: Medicare (Managed Care) | Attending: Internal Medicine

## 2015-06-23 ENCOUNTER — Other Ambulatory Visit: Payer: Self-pay | Admitting: Internal Medicine

## 2015-06-23 DIAGNOSIS — G8220 Paraplegia, unspecified: Secondary | ICD-10-CM | POA: Insufficient documentation

## 2015-06-23 DIAGNOSIS — M86151 Other acute osteomyelitis, right femur: Secondary | ICD-10-CM | POA: Insufficient documentation

## 2015-06-23 DIAGNOSIS — B964 Proteus (mirabilis) (morganii) as the cause of diseases classified elsewhere: Secondary | ICD-10-CM | POA: Diagnosis not present

## 2015-06-23 DIAGNOSIS — L89314 Pressure ulcer of right buttock, stage 4: Secondary | ICD-10-CM | POA: Diagnosis present

## 2015-06-23 DIAGNOSIS — I10 Essential (primary) hypertension: Secondary | ICD-10-CM | POA: Diagnosis not present

## 2015-06-28 ENCOUNTER — Encounter: Payer: Self-pay | Admitting: Neurology

## 2015-06-28 ENCOUNTER — Ambulatory Visit (INDEPENDENT_AMBULATORY_CARE_PROVIDER_SITE_OTHER): Payer: Medicare (Managed Care) | Admitting: Neurology

## 2015-06-28 VITALS — BP 102/64 | HR 68

## 2015-06-28 DIAGNOSIS — G8220 Paraplegia, unspecified: Secondary | ICD-10-CM

## 2015-06-28 MED ORDER — INCOBOTULINUMTOXINA 100 UNITS IM SOLR: 100.0000 [IU] | Freq: Once | INTRAMUSCULAR | Status: AC
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
## 2015-06-28 NOTE — Progress Notes (Signed)
**  Xeomin 100 units, Lot [id number], Exp 12/2016, [place] [id number], office supply**mck,rn.

## 2015-06-29 DIAGNOSIS — G8220 Paraplegia, unspecified: Secondary | ICD-10-CM | POA: Diagnosis not present

## 2015-06-29 MED ORDER — INCOBOTULINUMTOXINA 100 UNITS IM SOLR
100.0000 [IU] | Freq: Once | INTRAMUSCULAR | Status: AC
  Administered 2015-06-29: 100 [IU] via INTRAMUSCULAR

## 2015-07-04 ENCOUNTER — Ambulatory Visit (HOSPITAL_COMMUNITY)
Admission: RE | Admit: 2015-07-04 | Discharge: 2015-07-04 | Disposition: A | Discharge status: Home / Self Care | Payer: Medicare (Managed Care) | Source: Ambulatory Visit | Attending: Physician Assistant | Admitting: Physician Assistant

## 2015-07-04 ENCOUNTER — Ambulatory Visit (HOSPITAL_COMMUNITY): Admission: RE | Admit: 2015-07-04 | Payer: Medicare (Managed Care) | Source: Ambulatory Visit

## 2015-07-04 ENCOUNTER — Other Ambulatory Visit: Payer: Self-pay | Admitting: Physician Assistant

## 2015-07-04 DIAGNOSIS — K5900 Constipation, unspecified: Secondary | ICD-10-CM | POA: Insufficient documentation

## 2015-07-04 DIAGNOSIS — K5909 Other constipation: Secondary | ICD-10-CM

## 2015-07-04 DIAGNOSIS — N210 Calculus in bladder: Secondary | ICD-10-CM | POA: Diagnosis not present

## 2015-07-07 DIAGNOSIS — L89314 Pressure ulcer of right buttock, stage 4: Secondary | ICD-10-CM | POA: Diagnosis not present

## 2015-07-08 ENCOUNTER — Telehealth: Payer: Self-pay

## 2015-07-08 NOTE — Telephone Encounter (Signed)
-----   Message from Alfredia Ferguson, PA-C sent at 07/05/2015  2:33 PM EDT ----- Let pt know the Xray shows chronically distended loops of bowel- see how he is doing with BM's- he is wheelchair bound

## 2015-07-08 NOTE — Telephone Encounter (Signed)
Spouse states there is no change in his symptoms. He has completely liquid uncontrollable stool with

## 2015-07-08 NOTE — Telephone Encounter (Signed)
Spouse states he is having liquid stool.

## 2015-08-04 ENCOUNTER — Encounter (HOSPITAL_BASED_OUTPATIENT_CLINIC_OR_DEPARTMENT_OTHER): Payer: Medicare (Managed Care) | Attending: Internal Medicine

## 2015-08-04 DIAGNOSIS — L89314 Pressure ulcer of right buttock, stage 4: Secondary | ICD-10-CM | POA: Diagnosis present

## 2015-08-04 DIAGNOSIS — M868X8 Other osteomyelitis, other site: Secondary | ICD-10-CM | POA: Insufficient documentation

## 2015-08-04 DIAGNOSIS — S91102A Unspecified open wound of left great toe without damage to nail, initial encounter: Secondary | ICD-10-CM | POA: Insufficient documentation

## 2015-08-04 DIAGNOSIS — G8220 Paraplegia, unspecified: Secondary | ICD-10-CM | POA: Diagnosis not present

## 2015-08-04 DIAGNOSIS — X58XXXA Exposure to other specified factors, initial encounter: Secondary | ICD-10-CM | POA: Insufficient documentation

## 2015-08-04 DIAGNOSIS — I10 Essential (primary) hypertension: Secondary | ICD-10-CM | POA: Insufficient documentation

## 2015-08-04 DIAGNOSIS — M109 Gout, unspecified: Secondary | ICD-10-CM | POA: Diagnosis not present

## 2015-08-23 ENCOUNTER — Encounter: Payer: Self-pay | Admitting: Gastroenterology

## 2015-09-01 ENCOUNTER — Encounter (HOSPITAL_BASED_OUTPATIENT_CLINIC_OR_DEPARTMENT_OTHER): Payer: Medicare (Managed Care) | Attending: Internal Medicine

## 2015-09-01 DIAGNOSIS — L97529 Non-pressure chronic ulcer of other part of left foot with unspecified severity: Secondary | ICD-10-CM | POA: Diagnosis not present

## 2015-09-01 DIAGNOSIS — L89314 Pressure ulcer of right buttock, stage 4: Secondary | ICD-10-CM | POA: Diagnosis not present

## 2015-09-01 DIAGNOSIS — G8220 Paraplegia, unspecified: Secondary | ICD-10-CM | POA: Diagnosis not present

## 2015-09-01 DIAGNOSIS — M86651 Other chronic osteomyelitis, right thigh: Secondary | ICD-10-CM | POA: Diagnosis not present

## 2015-09-01 DIAGNOSIS — I10 Essential (primary) hypertension: Secondary | ICD-10-CM | POA: Insufficient documentation

## 2015-09-29 ENCOUNTER — Encounter (HOSPITAL_BASED_OUTPATIENT_CLINIC_OR_DEPARTMENT_OTHER): Payer: Medicare (Managed Care) | Attending: Internal Medicine

## 2015-09-29 DIAGNOSIS — L89314 Pressure ulcer of right buttock, stage 4: Secondary | ICD-10-CM | POA: Insufficient documentation

## 2015-09-29 DIAGNOSIS — G8220 Paraplegia, unspecified: Secondary | ICD-10-CM | POA: Diagnosis not present

## 2015-09-29 DIAGNOSIS — I10 Essential (primary) hypertension: Secondary | ICD-10-CM | POA: Diagnosis not present

## 2015-10-27 ENCOUNTER — Encounter (HOSPITAL_BASED_OUTPATIENT_CLINIC_OR_DEPARTMENT_OTHER): Payer: Medicare (Managed Care) | Attending: Internal Medicine

## 2015-10-27 DIAGNOSIS — K219 Gastro-esophageal reflux disease without esophagitis: Secondary | ICD-10-CM | POA: Diagnosis not present

## 2015-10-27 DIAGNOSIS — M109 Gout, unspecified: Secondary | ICD-10-CM | POA: Diagnosis not present

## 2015-10-27 DIAGNOSIS — G8220 Paraplegia, unspecified: Secondary | ICD-10-CM | POA: Insufficient documentation

## 2015-10-27 DIAGNOSIS — L89314 Pressure ulcer of right buttock, stage 4: Secondary | ICD-10-CM | POA: Diagnosis present

## 2015-10-27 DIAGNOSIS — M868X8 Other osteomyelitis, other site: Secondary | ICD-10-CM | POA: Diagnosis not present

## 2015-10-27 DIAGNOSIS — I10 Essential (primary) hypertension: Secondary | ICD-10-CM | POA: Diagnosis not present

## 2015-11-24 ENCOUNTER — Encounter (HOSPITAL_BASED_OUTPATIENT_CLINIC_OR_DEPARTMENT_OTHER): Payer: Medicare (Managed Care) | Attending: Internal Medicine

## 2015-11-24 DIAGNOSIS — G8220 Paraplegia, unspecified: Secondary | ICD-10-CM | POA: Diagnosis not present

## 2015-11-24 DIAGNOSIS — I10 Essential (primary) hypertension: Secondary | ICD-10-CM | POA: Diagnosis not present

## 2015-11-24 DIAGNOSIS — L89314 Pressure ulcer of right buttock, stage 4: Secondary | ICD-10-CM | POA: Diagnosis present

## 2015-12-22 ENCOUNTER — Encounter (HOSPITAL_BASED_OUTPATIENT_CLINIC_OR_DEPARTMENT_OTHER): Payer: Medicare (Managed Care) | Attending: Internal Medicine

## 2015-12-22 DIAGNOSIS — I10 Essential (primary) hypertension: Secondary | ICD-10-CM | POA: Diagnosis not present

## 2015-12-22 DIAGNOSIS — G8220 Paraplegia, unspecified: Secondary | ICD-10-CM | POA: Insufficient documentation

## 2015-12-22 DIAGNOSIS — L89314 Pressure ulcer of right buttock, stage 4: Secondary | ICD-10-CM | POA: Diagnosis not present

## 2015-12-22 DIAGNOSIS — M109 Gout, unspecified: Secondary | ICD-10-CM | POA: Diagnosis not present

## 2015-12-22 DIAGNOSIS — M868X8 Other osteomyelitis, other site: Secondary | ICD-10-CM | POA: Insufficient documentation

## 2016-01-26 ENCOUNTER — Encounter (HOSPITAL_BASED_OUTPATIENT_CLINIC_OR_DEPARTMENT_OTHER): Payer: Medicare (Managed Care) | Attending: Internal Medicine

## 2016-01-26 DIAGNOSIS — I10 Essential (primary) hypertension: Secondary | ICD-10-CM | POA: Insufficient documentation

## 2016-01-26 DIAGNOSIS — L89314 Pressure ulcer of right buttock, stage 4: Secondary | ICD-10-CM | POA: Diagnosis present

## 2016-01-26 DIAGNOSIS — G8220 Paraplegia, unspecified: Secondary | ICD-10-CM | POA: Diagnosis not present

## 2016-02-20 ENCOUNTER — Encounter (HOSPITAL_BASED_OUTPATIENT_CLINIC_OR_DEPARTMENT_OTHER): Payer: Medicare (Managed Care) | Attending: Internal Medicine

## 2016-02-20 ENCOUNTER — Other Ambulatory Visit: Payer: Self-pay | Admitting: Nurse Practitioner

## 2016-02-20 ENCOUNTER — Ambulatory Visit
Admission: RE | Admit: 2016-02-20 | Discharge: 2016-02-20 | Disposition: A | Discharge status: Home / Self Care | Payer: No Typology Code available for payment source | Source: Ambulatory Visit | Attending: Nurse Practitioner | Admitting: Nurse Practitioner

## 2016-02-20 DIAGNOSIS — M8618 Other acute osteomyelitis, other site: Secondary | ICD-10-CM

## 2016-02-20 DIAGNOSIS — L89523 Pressure ulcer of left ankle, stage 3: Secondary | ICD-10-CM | POA: Diagnosis present

## 2016-02-20 DIAGNOSIS — L97321 Non-pressure chronic ulcer of left ankle limited to breakdown of skin: Secondary | ICD-10-CM

## 2016-02-20 DIAGNOSIS — G8220 Paraplegia, unspecified: Secondary | ICD-10-CM | POA: Insufficient documentation

## 2016-02-20 DIAGNOSIS — L89314 Pressure ulcer of right buttock, stage 4: Secondary | ICD-10-CM | POA: Insufficient documentation

## 2016-02-20 DIAGNOSIS — I10 Essential (primary) hypertension: Secondary | ICD-10-CM | POA: Insufficient documentation

## 2016-02-23 ENCOUNTER — Encounter (HOSPITAL_BASED_OUTPATIENT_CLINIC_OR_DEPARTMENT_OTHER): Payer: Medicare (Managed Care)

## 2016-03-05 DIAGNOSIS — L89523 Pressure ulcer of left ankle, stage 3: Secondary | ICD-10-CM | POA: Diagnosis not present

## 2016-03-19 ENCOUNTER — Encounter (HOSPITAL_BASED_OUTPATIENT_CLINIC_OR_DEPARTMENT_OTHER): Payer: Medicare (Managed Care) | Attending: Internal Medicine

## 2016-03-19 DIAGNOSIS — L89314 Pressure ulcer of right buttock, stage 4: Secondary | ICD-10-CM | POA: Diagnosis not present

## 2016-03-19 DIAGNOSIS — L89523 Pressure ulcer of left ankle, stage 3: Secondary | ICD-10-CM | POA: Insufficient documentation

## 2016-03-19 DIAGNOSIS — G8220 Paraplegia, unspecified: Secondary | ICD-10-CM | POA: Diagnosis not present

## 2016-03-19 DIAGNOSIS — I10 Essential (primary) hypertension: Secondary | ICD-10-CM | POA: Insufficient documentation

## 2016-03-27 ENCOUNTER — Other Ambulatory Visit (HOSPITAL_COMMUNITY): Payer: Self-pay | Admitting: Family Medicine

## 2016-03-27 ENCOUNTER — Ambulatory Visit (HOSPITAL_COMMUNITY)
Admission: RE | Admit: 2016-03-27 | Discharge: 2016-03-27 | Disposition: A | Discharge status: Home / Self Care | Payer: Medicare (Managed Care) | Source: Ambulatory Visit | Attending: Family Medicine | Admitting: Family Medicine

## 2016-03-27 DIAGNOSIS — R197 Diarrhea, unspecified: Secondary | ICD-10-CM

## 2016-03-27 DIAGNOSIS — R143 Flatulence: Secondary | ICD-10-CM | POA: Diagnosis present

## 2016-04-03 ENCOUNTER — Ambulatory Visit (HOSPITAL_COMMUNITY)
Admission: RE | Admit: 2016-04-03 | Discharge: 2016-04-03 | Disposition: A | Discharge status: Home / Self Care | Payer: Medicare (Managed Care) | Source: Ambulatory Visit | Attending: Family Medicine | Admitting: Family Medicine

## 2016-04-03 ENCOUNTER — Other Ambulatory Visit (HOSPITAL_COMMUNITY): Payer: Self-pay | Admitting: Family Medicine

## 2016-04-03 DIAGNOSIS — R140 Abdominal distension (gaseous): Secondary | ICD-10-CM | POA: Insufficient documentation

## 2016-04-03 DIAGNOSIS — E559 Vitamin D deficiency, unspecified: Secondary | ICD-10-CM | POA: Insufficient documentation

## 2016-04-03 DIAGNOSIS — Z0000 Encounter for general adult medical examination without abnormal findings: Secondary | ICD-10-CM | POA: Insufficient documentation

## 2016-04-16 ENCOUNTER — Encounter (HOSPITAL_BASED_OUTPATIENT_CLINIC_OR_DEPARTMENT_OTHER): Payer: Medicare (Managed Care) | Attending: Internal Medicine

## 2016-04-16 DIAGNOSIS — M868X8 Other osteomyelitis, other site: Secondary | ICD-10-CM | POA: Insufficient documentation

## 2016-04-16 DIAGNOSIS — I10 Essential (primary) hypertension: Secondary | ICD-10-CM | POA: Insufficient documentation

## 2016-04-16 DIAGNOSIS — L89523 Pressure ulcer of left ankle, stage 3: Secondary | ICD-10-CM | POA: Insufficient documentation

## 2016-04-16 DIAGNOSIS — F1020 Alcohol dependence, uncomplicated: Secondary | ICD-10-CM | POA: Insufficient documentation

## 2016-04-16 DIAGNOSIS — L89314 Pressure ulcer of right buttock, stage 4: Secondary | ICD-10-CM | POA: Insufficient documentation

## 2016-04-16 DIAGNOSIS — K219 Gastro-esophageal reflux disease without esophagitis: Secondary | ICD-10-CM | POA: Insufficient documentation

## 2016-04-16 DIAGNOSIS — H269 Unspecified cataract: Secondary | ICD-10-CM | POA: Insufficient documentation

## 2016-04-16 DIAGNOSIS — M109 Gout, unspecified: Secondary | ICD-10-CM | POA: Insufficient documentation

## 2016-04-16 DIAGNOSIS — F329 Major depressive disorder, single episode, unspecified: Secondary | ICD-10-CM | POA: Insufficient documentation

## 2016-04-16 DIAGNOSIS — G8220 Paraplegia, unspecified: Secondary | ICD-10-CM | POA: Insufficient documentation

## 2016-04-19 DIAGNOSIS — M109 Gout, unspecified: Secondary | ICD-10-CM | POA: Diagnosis not present

## 2016-04-19 DIAGNOSIS — L89523 Pressure ulcer of left ankle, stage 3: Secondary | ICD-10-CM | POA: Diagnosis not present

## 2016-04-19 DIAGNOSIS — K219 Gastro-esophageal reflux disease without esophagitis: Secondary | ICD-10-CM | POA: Diagnosis not present

## 2016-04-19 DIAGNOSIS — M868X8 Other osteomyelitis, other site: Secondary | ICD-10-CM | POA: Diagnosis not present

## 2016-04-19 DIAGNOSIS — G8220 Paraplegia, unspecified: Secondary | ICD-10-CM | POA: Diagnosis not present

## 2016-04-19 DIAGNOSIS — L89314 Pressure ulcer of right buttock, stage 4: Secondary | ICD-10-CM | POA: Diagnosis not present

## 2016-04-19 DIAGNOSIS — H269 Unspecified cataract: Secondary | ICD-10-CM | POA: Diagnosis not present

## 2016-04-19 DIAGNOSIS — I10 Essential (primary) hypertension: Secondary | ICD-10-CM | POA: Diagnosis not present

## 2016-04-19 DIAGNOSIS — F1020 Alcohol dependence, uncomplicated: Secondary | ICD-10-CM | POA: Diagnosis not present

## 2016-04-19 DIAGNOSIS — F329 Major depressive disorder, single episode, unspecified: Secondary | ICD-10-CM | POA: Diagnosis not present

## 2016-05-03 DIAGNOSIS — L89314 Pressure ulcer of right buttock, stage 4: Secondary | ICD-10-CM | POA: Diagnosis not present

## 2016-05-10 ENCOUNTER — Other Ambulatory Visit: Payer: Self-pay | Admitting: Nurse Practitioner

## 2016-05-10 ENCOUNTER — Ambulatory Visit
Admission: RE | Admit: 2016-05-10 | Discharge: 2016-05-10 | Disposition: A | Discharge status: Home / Self Care | Payer: Medicare (Managed Care) | Source: Ambulatory Visit | Attending: Nurse Practitioner | Admitting: Nurse Practitioner

## 2016-05-10 DIAGNOSIS — M8618 Other acute osteomyelitis, other site: Secondary | ICD-10-CM

## 2016-05-14 ENCOUNTER — Other Ambulatory Visit: Payer: Self-pay | Admitting: Family Medicine

## 2016-05-14 ENCOUNTER — Ambulatory Visit
Admission: RE | Admit: 2016-05-14 | Discharge: 2016-05-14 | Disposition: A | Discharge status: Home / Self Care | Payer: Medicare (Managed Care) | Source: Ambulatory Visit | Attending: Family Medicine | Admitting: Family Medicine

## 2016-05-14 DIAGNOSIS — R0989 Other specified symptoms and signs involving the circulatory and respiratory systems: Secondary | ICD-10-CM

## 2016-05-17 ENCOUNTER — Encounter (HOSPITAL_BASED_OUTPATIENT_CLINIC_OR_DEPARTMENT_OTHER): Payer: Medicare (Managed Care) | Attending: Internal Medicine

## 2016-05-17 DIAGNOSIS — G8220 Paraplegia, unspecified: Secondary | ICD-10-CM | POA: Insufficient documentation

## 2016-05-17 DIAGNOSIS — I10 Essential (primary) hypertension: Secondary | ICD-10-CM | POA: Diagnosis not present

## 2016-05-17 DIAGNOSIS — L89523 Pressure ulcer of left ankle, stage 3: Secondary | ICD-10-CM | POA: Diagnosis not present

## 2016-05-17 DIAGNOSIS — L89314 Pressure ulcer of right buttock, stage 4: Secondary | ICD-10-CM | POA: Diagnosis present

## 2016-06-01 DIAGNOSIS — L89314 Pressure ulcer of right buttock, stage 4: Secondary | ICD-10-CM | POA: Diagnosis not present

## 2016-06-28 ENCOUNTER — Encounter (HOSPITAL_BASED_OUTPATIENT_CLINIC_OR_DEPARTMENT_OTHER): Payer: Medicare (Managed Care) | Attending: Internal Medicine

## 2016-06-28 DIAGNOSIS — I10 Essential (primary) hypertension: Secondary | ICD-10-CM | POA: Diagnosis not present

## 2016-06-28 DIAGNOSIS — L89523 Pressure ulcer of left ankle, stage 3: Secondary | ICD-10-CM | POA: Insufficient documentation

## 2016-06-28 DIAGNOSIS — G8220 Paraplegia, unspecified: Secondary | ICD-10-CM | POA: Diagnosis not present

## 2016-06-28 DIAGNOSIS — L89314 Pressure ulcer of right buttock, stage 4: Secondary | ICD-10-CM | POA: Insufficient documentation

## 2016-08-02 ENCOUNTER — Encounter (HOSPITAL_BASED_OUTPATIENT_CLINIC_OR_DEPARTMENT_OTHER): Payer: Medicare (Managed Care) | Attending: Internal Medicine

## 2016-08-02 DIAGNOSIS — M868X8 Other osteomyelitis, other site: Secondary | ICD-10-CM | POA: Insufficient documentation

## 2016-08-02 DIAGNOSIS — L89523 Pressure ulcer of left ankle, stage 3: Secondary | ICD-10-CM | POA: Insufficient documentation

## 2016-08-02 DIAGNOSIS — L89314 Pressure ulcer of right buttock, stage 4: Secondary | ICD-10-CM | POA: Diagnosis present

## 2016-08-02 DIAGNOSIS — I10 Essential (primary) hypertension: Secondary | ICD-10-CM | POA: Insufficient documentation

## 2016-08-02 DIAGNOSIS — G8220 Paraplegia, unspecified: Secondary | ICD-10-CM | POA: Diagnosis not present

## 2016-08-27 ENCOUNTER — Telehealth: Payer: Self-pay | Admitting: Cardiology

## 2016-08-27 NOTE — Telephone Encounter (Signed)
Received records from [facility] for appointment on 08/28/16 with Dr Percival Spanish.  Records put with Dr Hochrein's schedule for 08/28/16. lp
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
## 2016-08-28 ENCOUNTER — Encounter: Payer: Self-pay | Admitting: Cardiology

## 2016-08-28 ENCOUNTER — Ambulatory Visit (INDEPENDENT_AMBULATORY_CARE_PROVIDER_SITE_OTHER): Payer: Medicare (Managed Care) | Admitting: Cardiology

## 2016-08-28 ENCOUNTER — Telehealth: Payer: Self-pay | Admitting: *Deleted

## 2016-08-28 VITALS — BP 101/55 | HR 70 | Ht 75.5 in

## 2016-08-28 DIAGNOSIS — I483 Typical atrial flutter: Secondary | ICD-10-CM | POA: Insufficient documentation

## 2016-08-28 DIAGNOSIS — Z79899 Other long term (current) drug therapy: Secondary | ICD-10-CM

## 2016-08-28 DIAGNOSIS — R079 Chest pain, unspecified: Secondary | ICD-10-CM | POA: Diagnosis not present

## 2016-08-28 MED ORDER — APIXABAN 5 MG PO TABS: 5.0000 mg | ORAL_TABLET | Freq: Two times a day (BID) | ORAL | 6 refills | Status: AC

## 2016-08-28 NOTE — Telephone Encounter (Signed)
Spoke with Mariel Aloe at [facility] regarding Echocardiogram , Holter monitor, CBC and 1 month follow up appointment with Dr. Percival Spanish. These appointments were scheduled while Sonya and I were talking and I faxed the information to her along with the order for the patient's 1 week CBC

## 2016-08-28 NOTE — Patient Instructions (Signed)
Medication Instructions:  START- Eliquis 5 mg twice a day  Labwork: CBC 1 week  Testing/Procedures: Your physician has requested that you have an echocardiogram. Echocardiography is a painless test that uses sound waves to create images of your heart. It provides your doctor with information about the size and shape of your heart and how well your heart's chambers and valves are working. This procedure takes approximately one hour. There are no restrictions for this procedure.  Your physician has recommended that you wear a holter monitor. Holter monitors are medical devices that record the heart's electrical activity. Doctors most often use these monitors to diagnose arrhythmias. Arrhythmias are problems with the speed or rhythm of the heartbeat. The monitor is a small, portable device. You can wear one while you do your normal daily activities. This is usually used to diagnose what is causing palpitations/syncope (passing out).   Follow-Up: Your physician recommends that you schedule a follow-up appointment in: 1 Month   Any Other Special Instructions Will Be Listed Below (If Applicable).     If you need a refill on your cardiac medications before your next appointment, please call your pharmacy.

## 2016-08-30 NOTE — Addendum Note (Signed)
Addended by: Vennie Homans on: 08/30/2016 10:24 AM   Modules accepted: Orders

## 2016-09-05 ENCOUNTER — Telehealth: Payer: Self-pay | Admitting: Cardiology

## 2016-09-05 NOTE — Telephone Encounter (Signed)
Spoke with pt wife, she reports she was returning a call to nya. She does not know what the call was regarding. She is aware of his upcoming appointments. Will forward to nya to return the call.

## 2016-09-05 NOTE — Telephone Encounter (Signed)
Spoke with pt wife to let her know Dr Percival Spanish want [name] to stop aspirin, pt voice understanding.

## 2016-09-05 NOTE — Telephone Encounter (Signed)
New message    Pt wife is calling asking for a call back from RN about why she called last week.

## 2016-09-10 ENCOUNTER — Other Ambulatory Visit (HOSPITAL_COMMUNITY): Payer: Medicare (Managed Care)

## 2016-09-12 ENCOUNTER — Ambulatory Visit: Payer: Medicare (Managed Care)

## 2016-09-12 ENCOUNTER — Ambulatory Visit (HOSPITAL_COMMUNITY): Payer: Medicare (Managed Care)

## 2016-09-21 ENCOUNTER — Encounter (HOSPITAL_BASED_OUTPATIENT_CLINIC_OR_DEPARTMENT_OTHER): Payer: Medicare (Managed Care) | Attending: Internal Medicine

## 2016-09-21 ENCOUNTER — Ambulatory Visit (HOSPITAL_COMMUNITY): Payer: Medicare (Managed Care)

## 2016-09-21 DIAGNOSIS — E1140 Type 2 diabetes mellitus with diabetic neuropathy, unspecified: Secondary | ICD-10-CM | POA: Insufficient documentation

## 2016-09-21 DIAGNOSIS — L97319 Non-pressure chronic ulcer of right ankle with unspecified severity: Secondary | ICD-10-CM | POA: Diagnosis not present

## 2016-09-21 DIAGNOSIS — I10 Essential (primary) hypertension: Secondary | ICD-10-CM | POA: Diagnosis not present

## 2016-09-21 DIAGNOSIS — Z794 Long term (current) use of insulin: Secondary | ICD-10-CM | POA: Insufficient documentation

## 2016-09-21 DIAGNOSIS — E11622 Type 2 diabetes mellitus with other skin ulcer: Secondary | ICD-10-CM | POA: Insufficient documentation

## 2016-09-25 ENCOUNTER — Ambulatory Visit (HOSPITAL_COMMUNITY)
Admission: RE | Admit: 2016-09-25 | Discharge: 2016-09-25 | Disposition: A | Discharge status: Home / Self Care | Payer: Medicare (Managed Care) | Source: Ambulatory Visit | Attending: Cardiology | Admitting: Cardiology

## 2016-09-25 DIAGNOSIS — I483 Typical atrial flutter: Secondary | ICD-10-CM | POA: Diagnosis present

## 2016-09-25 DIAGNOSIS — I4891 Unspecified atrial fibrillation: Secondary | ICD-10-CM | POA: Diagnosis not present

## 2016-09-26 ENCOUNTER — Encounter: Payer: Self-pay | Admitting: Cardiology

## 2016-10-05 DIAGNOSIS — E11622 Type 2 diabetes mellitus with other skin ulcer: Secondary | ICD-10-CM | POA: Diagnosis not present

## 2016-10-09 ENCOUNTER — Ambulatory Visit: Payer: Medicare (Managed Care) | Admitting: Cardiology

## 2016-10-12 ENCOUNTER — Ambulatory Visit: Payer: Medicare (Managed Care) | Admitting: Cardiology

## 2016-11-02 ENCOUNTER — Encounter (HOSPITAL_BASED_OUTPATIENT_CLINIC_OR_DEPARTMENT_OTHER): Payer: Medicare (Managed Care) | Attending: Internal Medicine

## 2016-11-02 DIAGNOSIS — L89310 Pressure ulcer of right buttock, unstageable: Secondary | ICD-10-CM | POA: Diagnosis present

## 2016-11-02 DIAGNOSIS — G8220 Paraplegia, unspecified: Secondary | ICD-10-CM | POA: Diagnosis not present

## 2016-11-02 DIAGNOSIS — I10 Essential (primary) hypertension: Secondary | ICD-10-CM | POA: Diagnosis not present

## 2016-11-30 ENCOUNTER — Encounter (HOSPITAL_BASED_OUTPATIENT_CLINIC_OR_DEPARTMENT_OTHER): Payer: Medicare (Managed Care) | Attending: Internal Medicine

## 2016-11-30 DIAGNOSIS — L89310 Pressure ulcer of right buttock, unstageable: Secondary | ICD-10-CM | POA: Diagnosis present

## 2016-11-30 DIAGNOSIS — I10 Essential (primary) hypertension: Secondary | ICD-10-CM | POA: Insufficient documentation

## 2016-11-30 DIAGNOSIS — G8220 Paraplegia, unspecified: Secondary | ICD-10-CM | POA: Insufficient documentation
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
## 2016-12-18 ENCOUNTER — Ambulatory Visit (INDEPENDENT_AMBULATORY_CARE_PROVIDER_SITE_OTHER): Payer: Medicare (Managed Care) | Admitting: Cardiology

## 2016-12-18 ENCOUNTER — Encounter: Payer: Self-pay | Admitting: Cardiology

## 2016-12-18 VITALS — BP 117/76 | HR 60 | Ht 75.5 in

## 2016-12-18 DIAGNOSIS — I483 Typical atrial flutter: Secondary | ICD-10-CM | POA: Diagnosis not present

## 2016-12-18 NOTE — Patient Instructions (Signed)
Medication Instructions:  Continue current medications  If you need a refill on your cardiac medications before your next appointment, please call your pharmacy.  Labwork: None Ordered   Testing/Procedures: Your physician has recommended that you wear an 24 hour holter monitor. Holter monitors are medical devices that record the heart's electrical activity. Doctors most often use these monitors to diagnose arrhythmias. Arrhythmias are problems with the speed or rhythm of the heartbeat. The monitor is a small, portable device. You can wear one while you do your normal daily activities. This is usually used to diagnose what is causing palpitations/syncope (passing out).  Follow-Up: Your physician wants you to follow-up in: 1 Year with APP You should receive a reminder letter in the mail two months in advance. If you do not receive a letter, please call our office [phone].   Thank you for choosing [facility] at [facility]!!

## 2016-12-19 ENCOUNTER — Encounter: Payer: Self-pay | Admitting: Cardiology

## 2016-12-28 ENCOUNTER — Encounter (HOSPITAL_BASED_OUTPATIENT_CLINIC_OR_DEPARTMENT_OTHER): Payer: Medicare (Managed Care) | Attending: Internal Medicine

## 2016-12-28 DIAGNOSIS — G8220 Paraplegia, unspecified: Secondary | ICD-10-CM | POA: Insufficient documentation

## 2016-12-28 DIAGNOSIS — L89310 Pressure ulcer of right buttock, unstageable: Secondary | ICD-10-CM | POA: Insufficient documentation

## 2016-12-28 DIAGNOSIS — I10 Essential (primary) hypertension: Secondary | ICD-10-CM | POA: Insufficient documentation

## 2017-01-01 ENCOUNTER — Ambulatory Visit (INDEPENDENT_AMBULATORY_CARE_PROVIDER_SITE_OTHER): Payer: Medicare (Managed Care)

## 2017-01-01 DIAGNOSIS — I483 Typical atrial flutter: Secondary | ICD-10-CM

## 2017-01-25 ENCOUNTER — Encounter (HOSPITAL_BASED_OUTPATIENT_CLINIC_OR_DEPARTMENT_OTHER): Payer: Medicare (Managed Care) | Attending: Internal Medicine

## 2017-01-25 DIAGNOSIS — Z872 Personal history of diseases of the skin and subcutaneous tissue: Secondary | ICD-10-CM | POA: Diagnosis not present

## 2017-01-25 DIAGNOSIS — I10 Essential (primary) hypertension: Secondary | ICD-10-CM | POA: Diagnosis not present

## 2017-01-25 DIAGNOSIS — Z09 Encounter for follow-up examination after completed treatment for conditions other than malignant neoplasm: Secondary | ICD-10-CM | POA: Diagnosis not present

## 2017-01-25 DIAGNOSIS — G8220 Paraplegia, unspecified: Secondary | ICD-10-CM | POA: Diagnosis not present

## 2017-03-14 ENCOUNTER — Encounter (HOSPITAL_BASED_OUTPATIENT_CLINIC_OR_DEPARTMENT_OTHER): Payer: Medicare (Managed Care) | Attending: Internal Medicine

## 2017-03-14 DIAGNOSIS — G8220 Paraplegia, unspecified: Secondary | ICD-10-CM | POA: Diagnosis not present

## 2017-03-14 DIAGNOSIS — I10 Essential (primary) hypertension: Secondary | ICD-10-CM | POA: Diagnosis not present

## 2017-03-14 DIAGNOSIS — M868X8 Other osteomyelitis, other site: Secondary | ICD-10-CM | POA: Insufficient documentation

## 2017-03-14 DIAGNOSIS — L89313 Pressure ulcer of right buttock, stage 3: Secondary | ICD-10-CM | POA: Insufficient documentation

## 2017-03-28 DIAGNOSIS — L89313 Pressure ulcer of right buttock, stage 3: Secondary | ICD-10-CM | POA: Diagnosis not present

## 2017-04-18 ENCOUNTER — Encounter (HOSPITAL_BASED_OUTPATIENT_CLINIC_OR_DEPARTMENT_OTHER): Payer: Medicare (Managed Care) | Attending: Internal Medicine

## 2017-04-18 DIAGNOSIS — I10 Essential (primary) hypertension: Secondary | ICD-10-CM | POA: Insufficient documentation

## 2017-04-18 DIAGNOSIS — L89313 Pressure ulcer of right buttock, stage 3: Secondary | ICD-10-CM | POA: Diagnosis present

## 2017-04-18 DIAGNOSIS — R197 Diarrhea, unspecified: Secondary | ICD-10-CM | POA: Diagnosis not present

## 2017-04-18 DIAGNOSIS — G8220 Paraplegia, unspecified: Secondary | ICD-10-CM | POA: Insufficient documentation

## 2017-04-22 ENCOUNTER — Other Ambulatory Visit (HOSPITAL_COMMUNITY): Payer: Self-pay | Admitting: Family Medicine

## 2017-04-22 ENCOUNTER — Ambulatory Visit (HOSPITAL_COMMUNITY)
Admission: RE | Admit: 2017-04-22 | Discharge: 2017-04-22 | Disposition: A | Discharge status: Home / Self Care | Payer: Medicare (Managed Care) | Source: Ambulatory Visit | Attending: Family Medicine | Admitting: Family Medicine

## 2017-04-22 DIAGNOSIS — R195 Other fecal abnormalities: Secondary | ICD-10-CM

## 2017-04-22 DIAGNOSIS — N210 Calculus in bladder: Secondary | ICD-10-CM | POA: Diagnosis not present

## 2017-04-22 DIAGNOSIS — E876 Hypokalemia: Secondary | ICD-10-CM | POA: Diagnosis not present

## 2017-04-22 DIAGNOSIS — R140 Abdominal distension (gaseous): Secondary | ICD-10-CM | POA: Diagnosis present

## 2017-04-22 DIAGNOSIS — K6389 Other specified diseases of intestine: Secondary | ICD-10-CM | POA: Diagnosis not present

## 2017-04-22 DIAGNOSIS — K599 Functional intestinal disorder, unspecified: Secondary | ICD-10-CM | POA: Insufficient documentation

## 2017-05-02 DIAGNOSIS — L89313 Pressure ulcer of right buttock, stage 3: Secondary | ICD-10-CM | POA: Diagnosis not present

## 2017-05-09 ENCOUNTER — Other Ambulatory Visit (HOSPITAL_COMMUNITY): Payer: Self-pay | Admitting: Family Medicine

## 2017-05-09 DIAGNOSIS — I999 Unspecified disorder of circulatory system: Secondary | ICD-10-CM

## 2017-05-13 ENCOUNTER — Other Ambulatory Visit (HOSPITAL_COMMUNITY): Payer: Self-pay | Admitting: Family Medicine

## 2017-05-13 ENCOUNTER — Ambulatory Visit (HOSPITAL_COMMUNITY): Admission: RE | Admit: 2017-05-13 | Payer: Medicare (Managed Care) | Source: Ambulatory Visit

## 2017-05-13 ENCOUNTER — Ambulatory Visit (HOSPITAL_COMMUNITY)
Admission: RE | Admit: 2017-05-13 | Discharge: 2017-05-13 | Disposition: A | Discharge status: Home / Self Care | Payer: Medicare (Managed Care) | Source: Ambulatory Visit | Attending: Family Medicine | Admitting: Family Medicine

## 2017-05-13 DIAGNOSIS — L97519 Non-pressure chronic ulcer of other part of right foot with unspecified severity: Secondary | ICD-10-CM

## 2017-05-13 DIAGNOSIS — L97529 Non-pressure chronic ulcer of other part of left foot with unspecified severity: Secondary | ICD-10-CM

## 2017-05-13 NOTE — Progress Notes (Signed)
VASCULAR LAB PRELIMINARY  ARTERIAL  ABI completed:    RIGHT    LEFT    PRESSURE WAVEFORM  PRESSURE WAVEFORM  BRACHIAL 145 Tri BRACHIAL 156 Tri  DP   DP 185 Bi  AT 186 Tri AT    PT 211 Tri PT 193 Bi  GREAT TOE  NA GREAT TOE  NA    RIGHT LEFT  ABI 1.35 1.24   Suboptimal exam, patient sitting in wheelchair. Significantly swollen feet. Unable to assess TBI's due to ulcers.   Final Interpretation: Right: Resting right ankle-brachial index is within normal range. No evidence of significant right lower extremity arterial disease. Left: Resting left ankle-brachial index is within normal range. No evidence of significant left lower extremity arterial disease.     Everrett Coombe, RVT 05/13/2017, 3:30 PM

## 2017-05-16 ENCOUNTER — Encounter (HOSPITAL_BASED_OUTPATIENT_CLINIC_OR_DEPARTMENT_OTHER): Payer: Medicare (Managed Care) | Attending: Internal Medicine

## 2017-05-16 DIAGNOSIS — L89313 Pressure ulcer of right buttock, stage 3: Secondary | ICD-10-CM | POA: Diagnosis present

## 2017-05-16 DIAGNOSIS — L89893 Pressure ulcer of other site, stage 3: Secondary | ICD-10-CM | POA: Diagnosis not present

## 2017-05-16 DIAGNOSIS — G8220 Paraplegia, unspecified: Secondary | ICD-10-CM | POA: Diagnosis not present

## 2017-05-16 DIAGNOSIS — I10 Essential (primary) hypertension: Secondary | ICD-10-CM | POA: Diagnosis not present

## 2017-05-16 DIAGNOSIS — M869 Osteomyelitis, unspecified: Secondary | ICD-10-CM | POA: Insufficient documentation

## 2017-05-16 DIAGNOSIS — L97522 Non-pressure chronic ulcer of other part of left foot with fat layer exposed: Secondary | ICD-10-CM | POA: Diagnosis not present

## 2017-05-17 ENCOUNTER — Other Ambulatory Visit (HOSPITAL_COMMUNITY): Payer: Self-pay | Admitting: Nurse Practitioner

## 2017-05-17 ENCOUNTER — Ambulatory Visit (HOSPITAL_COMMUNITY)
Admission: RE | Admit: 2017-05-17 | Discharge: 2017-05-17 | Disposition: A | Discharge status: Home / Self Care | Payer: Medicare (Managed Care) | Source: Ambulatory Visit | Attending: Nurse Practitioner | Admitting: Nurse Practitioner

## 2017-05-17 DIAGNOSIS — M869 Osteomyelitis, unspecified: Secondary | ICD-10-CM

## 2017-05-17 DIAGNOSIS — L89314 Pressure ulcer of right buttock, stage 4: Secondary | ICD-10-CM | POA: Diagnosis present

## 2017-05-17 DIAGNOSIS — M86659 Other chronic osteomyelitis, unspecified thigh: Secondary | ICD-10-CM | POA: Insufficient documentation

## 2017-05-17 DIAGNOSIS — L89522 Pressure ulcer of left ankle, stage 2: Secondary | ICD-10-CM | POA: Diagnosis present

## 2017-05-17 DIAGNOSIS — M7989 Other specified soft tissue disorders: Secondary | ICD-10-CM | POA: Diagnosis not present

## 2017-05-22 ENCOUNTER — Ambulatory Visit (INDEPENDENT_AMBULATORY_CARE_PROVIDER_SITE_OTHER): Payer: Medicare (Managed Care) | Admitting: Neurology

## 2017-05-22 ENCOUNTER — Encounter: Payer: Self-pay | Admitting: Neurology

## 2017-05-22 ENCOUNTER — Encounter (INDEPENDENT_AMBULATORY_CARE_PROVIDER_SITE_OTHER): Payer: Self-pay

## 2017-05-22 VITALS — BP 163/87 | HR 59

## 2017-05-22 DIAGNOSIS — F03918 Unspecified dementia, unspecified severity, with other behavioral disturbance: Secondary | ICD-10-CM | POA: Insufficient documentation

## 2017-05-22 DIAGNOSIS — F0391 Unspecified dementia with behavioral disturbance: Secondary | ICD-10-CM | POA: Diagnosis not present

## 2017-05-22 DIAGNOSIS — G8250 Quadriplegia, unspecified: Secondary | ICD-10-CM | POA: Diagnosis not present
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
## 2017-05-23 ENCOUNTER — Telehealth: Payer: Self-pay | Admitting: *Deleted

## 2017-05-23 LAB — COMPREHENSIVE METABOLIC PANEL
A/G RATIO: 1.8 (ref 1.2–2.2)
ALT: 10 IU/L (ref 0–44)
AST: 16 IU/L (ref 0–40)
Albumin: 3.6 g/dL (ref 3.5–4.7)
Alkaline Phosphatase: 104 IU/L (ref 39–117)
BUN/Creatinine Ratio: 27 — ABNORMAL HIGH (ref 10–24)
BUN: 23 mg/dL (ref 8–27)
CHLORIDE: 106 mmol/L (ref 96–106)
CO2: 26 mmol/L (ref 20–29)
Calcium: 8.6 mg/dL (ref 8.6–10.2)
Creatinine, Ser: 0.85 mg/dL (ref 0.76–1.27)
GFR calc Af Amer: 94 mL/min/{1.73_m2} (ref >59)
GFR calc non Af Amer: 82 mL/min/{1.73_m2} (ref >59)
GLUCOSE: 84 mg/dL (ref 65–99)
Globulin, Total: 2 g/dL (ref 1.5–4.5)
POTASSIUM: 4.6 mmol/L (ref 3.5–5.2)
Sodium: 145 mmol/L — ABNORMAL HIGH (ref 134–144)
Total Protein: 5.6 g/dL — ABNORMAL LOW (ref 6.0–8.5)

## 2017-05-23 LAB — CBC WITH DIFFERENTIAL/PLATELET
BASOS ABS: 0 10*3/uL (ref 0.0–0.2)
BASOS: 0 %
EOS (ABSOLUTE): 0.2 10*3/uL (ref 0.0–0.4)
Eos: 6 %
Hematocrit: 40.3 % (ref 37.5–51.0)
Hemoglobin: 12.9 g/dL — ABNORMAL LOW (ref 13.0–17.7)
IMMATURE GRANULOCYTES: 1 %
Immature Grans (Abs): 0 10*3/uL (ref 0.0–0.1)
Lymphocytes Absolute: 1.2 10*3/uL (ref 0.7–3.1)
Lymphs: 31 %
MCH: 27 pg (ref 26.6–33.0)
MCHC: 32 g/dL (ref 31.5–35.7)
MCV: 85 fL (ref 79–97)
MONOS ABS: 0.5 10*3/uL (ref 0.1–0.9)
Monocytes: 13 %
NEUTROS ABS: 1.9 10*3/uL (ref 1.4–7.0)
NEUTROS PCT: 49 %
PLATELETS: 243 10*3/uL (ref 150–379)
RBC: 4.77 x10E6/uL (ref 4.14–5.80)
RDW: 16.2 % — AB (ref 12.3–15.4)
WBC: 3.9 10*3/uL (ref 3.4–10.8)

## 2017-05-23 LAB — VITAMIN B12: VITAMIN B 12: 776 pg/mL (ref 232–1245)

## 2017-05-23 LAB — RPR: RPR Ser Ql: NONREACTIVE

## 2017-05-23 LAB — TSH: TSH: 1.91 u[IU]/mL (ref 0.450–4.500)

## 2017-05-23 NOTE — Telephone Encounter (Signed)
Spoke to his wife on HIPAA - she is aware of his lab results.

## 2017-05-23 NOTE — Telephone Encounter (Signed)
-----   Message from Marcial Pacas, MD sent at 05/23/2017  8:09 AM EST ----- Please call patient for mild anemia with hemoglobin of 12.9, mild elevated RDW 16.2, which is at his baseline, rest of the laboratory evaluation showed no significant abnormality.

## 2017-05-30 DIAGNOSIS — L89313 Pressure ulcer of right buttock, stage 3: Secondary | ICD-10-CM | POA: Diagnosis not present

## 2017-06-13 ENCOUNTER — Encounter (HOSPITAL_BASED_OUTPATIENT_CLINIC_OR_DEPARTMENT_OTHER): Payer: Medicare (Managed Care) | Attending: Internal Medicine

## 2017-06-13 DIAGNOSIS — L89892 Pressure ulcer of other site, stage 2: Secondary | ICD-10-CM | POA: Insufficient documentation

## 2017-06-13 DIAGNOSIS — L89313 Pressure ulcer of right buttock, stage 3: Secondary | ICD-10-CM | POA: Diagnosis not present

## 2017-06-13 DIAGNOSIS — G8220 Paraplegia, unspecified: Secondary | ICD-10-CM | POA: Diagnosis not present

## 2017-06-13 DIAGNOSIS — L97522 Non-pressure chronic ulcer of other part of left foot with fat layer exposed: Secondary | ICD-10-CM | POA: Diagnosis not present

## 2017-06-13 DIAGNOSIS — I10 Essential (primary) hypertension: Secondary | ICD-10-CM | POA: Diagnosis not present

## 2017-07-11 ENCOUNTER — Encounter (HOSPITAL_BASED_OUTPATIENT_CLINIC_OR_DEPARTMENT_OTHER): Payer: Medicare (Managed Care) | Attending: Internal Medicine

## 2017-07-11 DIAGNOSIS — L89892 Pressure ulcer of other site, stage 2: Secondary | ICD-10-CM | POA: Insufficient documentation

## 2017-07-11 DIAGNOSIS — I10 Essential (primary) hypertension: Secondary | ICD-10-CM | POA: Diagnosis not present

## 2017-07-11 DIAGNOSIS — G8220 Paraplegia, unspecified: Secondary | ICD-10-CM | POA: Insufficient documentation

## 2017-07-11 DIAGNOSIS — L97529 Non-pressure chronic ulcer of other part of left foot with unspecified severity: Secondary | ICD-10-CM | POA: Insufficient documentation

## 2017-07-11 DIAGNOSIS — L89313 Pressure ulcer of right buttock, stage 3: Secondary | ICD-10-CM | POA: Diagnosis present

## 2017-08-01 DIAGNOSIS — L89313 Pressure ulcer of right buttock, stage 3: Secondary | ICD-10-CM | POA: Diagnosis not present

## 2017-08-22 ENCOUNTER — Encounter (HOSPITAL_BASED_OUTPATIENT_CLINIC_OR_DEPARTMENT_OTHER): Payer: Medicare (Managed Care)

## 2017-09-26 ENCOUNTER — Other Ambulatory Visit (HOSPITAL_COMMUNITY): Payer: Self-pay | Admitting: Nurse Practitioner

## 2017-09-26 ENCOUNTER — Ambulatory Visit (HOSPITAL_COMMUNITY)
Admission: RE | Admit: 2017-09-26 | Discharge: 2017-09-26 | Disposition: A | Discharge status: Home / Self Care | Payer: Medicare (Managed Care) | Source: Ambulatory Visit | Attending: Nurse Practitioner | Admitting: Nurse Practitioner

## 2017-09-26 DIAGNOSIS — R059 Cough, unspecified: Secondary | ICD-10-CM

## 2017-09-26 DIAGNOSIS — I517 Cardiomegaly: Secondary | ICD-10-CM | POA: Insufficient documentation

## 2017-09-26 DIAGNOSIS — R140 Abdominal distension (gaseous): Secondary | ICD-10-CM | POA: Diagnosis present

## 2017-09-26 DIAGNOSIS — J9811 Atelectasis: Secondary | ICD-10-CM | POA: Insufficient documentation

## 2017-09-26 DIAGNOSIS — R05 Cough: Secondary | ICD-10-CM | POA: Diagnosis not present

## 2017-10-04 ENCOUNTER — Other Ambulatory Visit: Payer: Self-pay | Admitting: Family Medicine

## 2017-10-04 ENCOUNTER — Encounter (HOSPITAL_BASED_OUTPATIENT_CLINIC_OR_DEPARTMENT_OTHER): Payer: Medicare (Managed Care) | Attending: Internal Medicine

## 2017-10-04 ENCOUNTER — Other Ambulatory Visit (HOSPITAL_COMMUNITY)
Admission: RE | Admit: 2017-10-04 | Discharge: 2017-10-04 | Disposition: A | Discharge status: Home / Self Care | Payer: Medicare (Managed Care) | Source: Other Acute Inpatient Hospital | Attending: Internal Medicine | Admitting: Internal Medicine

## 2017-10-04 ENCOUNTER — Other Ambulatory Visit (HOSPITAL_COMMUNITY): Payer: Self-pay | Admitting: Family Medicine

## 2017-10-04 DIAGNOSIS — G8220 Paraplegia, unspecified: Secondary | ICD-10-CM | POA: Insufficient documentation

## 2017-10-04 DIAGNOSIS — L89314 Pressure ulcer of right buttock, stage 4: Secondary | ICD-10-CM | POA: Diagnosis not present

## 2017-10-04 DIAGNOSIS — L89523 Pressure ulcer of left ankle, stage 3: Secondary | ICD-10-CM | POA: Insufficient documentation

## 2017-10-04 DIAGNOSIS — L89524 Pressure ulcer of left ankle, stage 4: Secondary | ICD-10-CM | POA: Diagnosis not present

## 2017-10-04 DIAGNOSIS — N200 Calculus of kidney: Secondary | ICD-10-CM

## 2017-10-04 DIAGNOSIS — I10 Essential (primary) hypertension: Secondary | ICD-10-CM | POA: Diagnosis not present

## 2017-10-04 DIAGNOSIS — L89893 Pressure ulcer of other site, stage 3: Secondary | ICD-10-CM | POA: Diagnosis not present

## 2017-10-09 LAB — AEROBIC CULTURE  (SUPERFICIAL SPECIMEN)

## 2017-10-09 LAB — AEROBIC CULTURE W GRAM STAIN (SUPERFICIAL SPECIMEN)

## 2017-10-18 ENCOUNTER — Encounter (HOSPITAL_BASED_OUTPATIENT_CLINIC_OR_DEPARTMENT_OTHER): Payer: Medicare (Managed Care) | Attending: Internal Medicine

## 2017-10-18 DIAGNOSIS — L89893 Pressure ulcer of other site, stage 3: Secondary | ICD-10-CM | POA: Diagnosis not present

## 2017-10-18 DIAGNOSIS — L89524 Pressure ulcer of left ankle, stage 4: Secondary | ICD-10-CM | POA: Diagnosis not present

## 2017-10-18 DIAGNOSIS — I10 Essential (primary) hypertension: Secondary | ICD-10-CM | POA: Insufficient documentation

## 2017-10-18 DIAGNOSIS — L89314 Pressure ulcer of right buttock, stage 4: Secondary | ICD-10-CM | POA: Diagnosis not present

## 2017-10-18 DIAGNOSIS — G8220 Paraplegia, unspecified: Secondary | ICD-10-CM | POA: Diagnosis not present

## 2017-10-21 ENCOUNTER — Ambulatory Visit
Admission: RE | Admit: 2017-10-21 | Discharge: 2017-10-21 | Disposition: A | Discharge status: Home / Self Care | Payer: Medicare (Managed Care) | Source: Ambulatory Visit | Attending: Internal Medicine | Admitting: Internal Medicine

## 2017-10-21 ENCOUNTER — Other Ambulatory Visit: Payer: Self-pay | Admitting: Internal Medicine

## 2017-10-21 DIAGNOSIS — L089 Local infection of the skin and subcutaneous tissue, unspecified: Secondary | ICD-10-CM

## 2017-10-21 DIAGNOSIS — S31109A Unspecified open wound of abdominal wall, unspecified quadrant without penetration into peritoneal cavity, initial encounter: Principal | ICD-10-CM

## 2017-10-22 ENCOUNTER — Ambulatory Visit (HOSPITAL_COMMUNITY): Payer: Medicare (Managed Care)

## 2017-11-01 ENCOUNTER — Ambulatory Visit
Admission: RE | Admit: 2017-11-01 | Discharge: 2017-11-01 | Disposition: A | Discharge status: Home / Self Care | Payer: No Typology Code available for payment source | Source: Ambulatory Visit | Attending: Family Medicine | Admitting: Family Medicine

## 2017-11-01 ENCOUNTER — Other Ambulatory Visit: Payer: Self-pay | Admitting: Family Medicine

## 2017-11-01 DIAGNOSIS — M869 Osteomyelitis, unspecified: Secondary | ICD-10-CM

## 2017-11-01 DIAGNOSIS — L89893 Pressure ulcer of other site, stage 3: Secondary | ICD-10-CM | POA: Diagnosis not present

## 2017-11-14 ENCOUNTER — Encounter (HOSPITAL_BASED_OUTPATIENT_CLINIC_OR_DEPARTMENT_OTHER): Payer: Medicare (Managed Care) | Attending: Internal Medicine

## 2017-11-14 DIAGNOSIS — Z87891 Personal history of nicotine dependence: Secondary | ICD-10-CM | POA: Insufficient documentation

## 2017-11-14 DIAGNOSIS — F0390 Unspecified dementia without behavioral disturbance: Secondary | ICD-10-CM | POA: Diagnosis not present

## 2017-11-14 DIAGNOSIS — L89524 Pressure ulcer of left ankle, stage 4: Secondary | ICD-10-CM | POA: Insufficient documentation

## 2017-11-14 DIAGNOSIS — G8220 Paraplegia, unspecified: Secondary | ICD-10-CM | POA: Insufficient documentation

## 2017-11-14 DIAGNOSIS — I10 Essential (primary) hypertension: Secondary | ICD-10-CM | POA: Insufficient documentation

## 2017-11-14 DIAGNOSIS — W228XXA Striking against or struck by other objects, initial encounter: Secondary | ICD-10-CM | POA: Diagnosis not present

## 2017-11-14 DIAGNOSIS — S81011A Laceration without foreign body, right knee, initial encounter: Secondary | ICD-10-CM | POA: Insufficient documentation

## 2017-11-14 DIAGNOSIS — L89314 Pressure ulcer of right buttock, stage 4: Secondary | ICD-10-CM | POA: Diagnosis not present

## 2017-11-14 DIAGNOSIS — L89893 Pressure ulcer of other site, stage 3: Secondary | ICD-10-CM | POA: Diagnosis present

## 2017-11-29 DIAGNOSIS — L89893 Pressure ulcer of other site, stage 3: Secondary | ICD-10-CM | POA: Diagnosis not present

## 2018-01-02 ENCOUNTER — Encounter (HOSPITAL_BASED_OUTPATIENT_CLINIC_OR_DEPARTMENT_OTHER): Payer: Medicare (Managed Care) | Attending: Internal Medicine

## 2018-01-02 DIAGNOSIS — L97812 Non-pressure chronic ulcer of other part of right lower leg with fat layer exposed: Secondary | ICD-10-CM | POA: Insufficient documentation

## 2018-01-02 DIAGNOSIS — L89893 Pressure ulcer of other site, stage 3: Secondary | ICD-10-CM | POA: Diagnosis not present

## 2018-01-02 DIAGNOSIS — G8220 Paraplegia, unspecified: Secondary | ICD-10-CM | POA: Insufficient documentation

## 2018-01-02 DIAGNOSIS — L89314 Pressure ulcer of right buttock, stage 4: Secondary | ICD-10-CM | POA: Insufficient documentation

## 2018-01-02 DIAGNOSIS — I10 Essential (primary) hypertension: Secondary | ICD-10-CM | POA: Diagnosis not present

## 2018-01-02 DIAGNOSIS — L89524 Pressure ulcer of left ankle, stage 4: Secondary | ICD-10-CM | POA: Diagnosis not present

## 2018-01-30 ENCOUNTER — Encounter (HOSPITAL_BASED_OUTPATIENT_CLINIC_OR_DEPARTMENT_OTHER): Payer: Medicare (Managed Care) | Attending: Internal Medicine

## 2018-01-30 DIAGNOSIS — L97529 Non-pressure chronic ulcer of other part of left foot with unspecified severity: Secondary | ICD-10-CM | POA: Diagnosis not present

## 2018-01-30 DIAGNOSIS — I10 Essential (primary) hypertension: Secondary | ICD-10-CM | POA: Insufficient documentation

## 2018-01-30 DIAGNOSIS — G8220 Paraplegia, unspecified: Secondary | ICD-10-CM | POA: Diagnosis not present

## 2018-01-30 DIAGNOSIS — L89314 Pressure ulcer of right buttock, stage 4: Secondary | ICD-10-CM | POA: Diagnosis not present

## 2018-01-30 DIAGNOSIS — X58XXXA Exposure to other specified factors, initial encounter: Secondary | ICD-10-CM | POA: Diagnosis not present

## 2018-01-30 DIAGNOSIS — S81001A Unspecified open wound, right knee, initial encounter: Secondary | ICD-10-CM | POA: Insufficient documentation

## 2018-01-30 DIAGNOSIS — L89524 Pressure ulcer of left ankle, stage 4: Secondary | ICD-10-CM | POA: Diagnosis not present

## 2018-03-10 ENCOUNTER — Encounter (HOSPITAL_BASED_OUTPATIENT_CLINIC_OR_DEPARTMENT_OTHER): Payer: Medicare (Managed Care) | Attending: Internal Medicine

## 2018-03-10 DIAGNOSIS — L89524 Pressure ulcer of left ankle, stage 4: Secondary | ICD-10-CM | POA: Diagnosis not present

## 2018-03-10 DIAGNOSIS — I10 Essential (primary) hypertension: Secondary | ICD-10-CM | POA: Diagnosis not present

## 2018-03-10 DIAGNOSIS — L97523 Non-pressure chronic ulcer of other part of left foot with necrosis of muscle: Secondary | ICD-10-CM | POA: Diagnosis not present

## 2018-03-10 DIAGNOSIS — L89314 Pressure ulcer of right buttock, stage 4: Secondary | ICD-10-CM | POA: Diagnosis not present

## 2018-03-10 DIAGNOSIS — G8220 Paraplegia, unspecified: Secondary | ICD-10-CM | POA: Diagnosis not present

## 2018-04-10 ENCOUNTER — Encounter (HOSPITAL_BASED_OUTPATIENT_CLINIC_OR_DEPARTMENT_OTHER): Payer: Medicare (Managed Care) | Attending: Internal Medicine

## 2018-04-10 DIAGNOSIS — I10 Essential (primary) hypertension: Secondary | ICD-10-CM | POA: Insufficient documentation

## 2018-04-10 DIAGNOSIS — L89524 Pressure ulcer of left ankle, stage 4: Secondary | ICD-10-CM | POA: Insufficient documentation

## 2018-04-10 DIAGNOSIS — L89314 Pressure ulcer of right buttock, stage 4: Secondary | ICD-10-CM | POA: Insufficient documentation

## 2018-04-10 DIAGNOSIS — G8220 Paraplegia, unspecified: Secondary | ICD-10-CM | POA: Insufficient documentation

## 2018-04-10 DIAGNOSIS — F0390 Unspecified dementia without behavioral disturbance: Secondary | ICD-10-CM | POA: Insufficient documentation

## 2018-04-17 DIAGNOSIS — L89314 Pressure ulcer of right buttock, stage 4: Secondary | ICD-10-CM | POA: Diagnosis not present

## 2018-04-17 DIAGNOSIS — L89524 Pressure ulcer of left ankle, stage 4: Secondary | ICD-10-CM | POA: Diagnosis not present

## 2018-04-17 DIAGNOSIS — F0390 Unspecified dementia without behavioral disturbance: Secondary | ICD-10-CM | POA: Diagnosis not present

## 2018-04-17 DIAGNOSIS — I10 Essential (primary) hypertension: Secondary | ICD-10-CM | POA: Diagnosis not present

## 2018-04-17 DIAGNOSIS — G8220 Paraplegia, unspecified: Secondary | ICD-10-CM | POA: Diagnosis not present

## 2018-05-19 ENCOUNTER — Encounter (HOSPITAL_BASED_OUTPATIENT_CLINIC_OR_DEPARTMENT_OTHER): Payer: Medicare (Managed Care) | Attending: Internal Medicine

## 2018-05-19 DIAGNOSIS — G8220 Paraplegia, unspecified: Secondary | ICD-10-CM | POA: Diagnosis not present

## 2018-05-19 DIAGNOSIS — I10 Essential (primary) hypertension: Secondary | ICD-10-CM | POA: Diagnosis not present

## 2018-05-19 DIAGNOSIS — L89524 Pressure ulcer of left ankle, stage 4: Secondary | ICD-10-CM | POA: Insufficient documentation

## 2018-05-19 DIAGNOSIS — L89314 Pressure ulcer of right buttock, stage 4: Secondary | ICD-10-CM | POA: Insufficient documentation

## 2018-08-08 DEATH — deceased
